# Patient Record
Sex: Female | Born: 1995 | Race: White | Hispanic: Yes | Marital: Single | Smoking: Never smoker
Health system: Southern US, Community
[De-identification: ages and names within clinical notes are randomized; demographics above are authoritative.]

## PROBLEM LIST (undated history)

## (undated) ENCOUNTER — Inpatient Hospital Stay (HOSPITAL_COMMUNITY): Payer: Self-pay

## (undated) DIAGNOSIS — Z789 Other specified health status: Secondary | ICD-10-CM

## (undated) HISTORY — PX: NO PAST SURGERIES: SHX2092

---
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
## 2017-01-28 DIAGNOSIS — A749 Chlamydial infection, unspecified: Secondary | ICD-10-CM

## 2017-01-28 HISTORY — DX: Chlamydial infection, unspecified: A74.9

## 2017-08-03 ENCOUNTER — Emergency Department (HOSPITAL_COMMUNITY): Payer: Self-pay

## 2017-08-03 ENCOUNTER — Encounter (HOSPITAL_COMMUNITY): Payer: Self-pay | Admitting: Emergency Medicine

## 2017-08-03 ENCOUNTER — Emergency Department (HOSPITAL_COMMUNITY)
Admission: EM | Admit: 2017-08-03 | Discharge: 2017-08-03 | Disposition: A | Discharge status: Home / Self Care | Payer: Self-pay | Attending: Emergency Medicine | Admitting: Emergency Medicine

## 2017-08-03 DIAGNOSIS — O209 Hemorrhage in early pregnancy, unspecified: Secondary | ICD-10-CM | POA: Insufficient documentation

## 2017-08-03 DIAGNOSIS — O23591 Infection of other part of genital tract in pregnancy, first trimester: Secondary | ICD-10-CM | POA: Insufficient documentation

## 2017-08-03 DIAGNOSIS — N939 Abnormal uterine and vaginal bleeding, unspecified: Secondary | ICD-10-CM

## 2017-08-03 DIAGNOSIS — O200 Threatened abortion: Secondary | ICD-10-CM | POA: Insufficient documentation

## 2017-08-03 DIAGNOSIS — N760 Acute vaginitis: Secondary | ICD-10-CM

## 2017-08-03 DIAGNOSIS — B9689 Other specified bacterial agents as the cause of diseases classified elsewhere: Secondary | ICD-10-CM

## 2017-08-03 DIAGNOSIS — Z3A01 Less than 8 weeks gestation of pregnancy: Secondary | ICD-10-CM | POA: Insufficient documentation

## 2017-08-03 LAB — WET PREP, GENITAL
SPERM: NONE SEEN
Trich, Wet Prep: NONE SEEN
Yeast Wet Prep HPF POC: NONE SEEN

## 2017-08-03 LAB — URINALYSIS, DIPSTICK ONLY
Bilirubin Urine: NEGATIVE
GLUCOSE, UA: NEGATIVE mg/dL
Ketones, ur: NEGATIVE mg/dL
Leukocytes, UA: NEGATIVE
Nitrite: NEGATIVE
PROTEIN: NEGATIVE mg/dL
SPECIFIC GRAVITY, URINE: 1.005 (ref 1.005–1.030)
pH: 7 (ref 5.0–8.0)

## 2017-08-03 LAB — ABO/RH: ABO/RH(D): O POS

## 2017-08-03 LAB — HCG, QUANTITATIVE, PREGNANCY: HCG, BETA CHAIN, QUANT, S: 5795 m[IU]/mL — AB (ref <5)

## 2017-08-03 MED ORDER — ACETAMINOPHEN 325 MG PO TABS
650.0000 mg | ORAL_TABLET | Freq: Once | ORAL | Status: DC
  Filled 2017-08-03: qty 2

## 2017-08-03 MED ORDER — METRONIDAZOLE 250 MG PO TABS: 250.0000 mg | ORAL_TABLET | Freq: Three times a day (TID) | ORAL | 0 refills | Status: AC

## 2017-08-03 NOTE — ED Triage Notes (Signed)
Pt presents to ED for assessment of vaginal bleeding with several positive home pregnancy tests.  Patient c/o some mid lower abdominal cramping.  Pt's LMP prior to this bleeding was 6/6.

## 2017-08-03 NOTE — ED Provider Notes (Signed)
[facility] EMERGENCY DEPARTMENT Provider Note   CSN: [id number] Arrival date & time: 08/03/17  1423   History   Chief Complaint Chief Complaint  Patient presents with  . Vaginal Bleeding    HPI Patient is a G1P0000 presenting to the ED for lower abdominal pain and vaginal bleeding. She states her LMP was 06/29/2017. She has had mild lower abdominal cramping for about 3-4 days and had onset of "light" vaginal bleeding this morning. She vomited once this morning. No fever, dysuria, or diarrhea. No vaginal discharge. No contraception. No other recent illness.   History reviewed. No pertinent past medical history.  There are no active problems to display for this patient.  History reviewed. No pertinent surgical history.   OB History    Gravida  1   Para      Term      Preterm      AB      Living        SAB      TAB      Ectopic      Multiple      Live Births               Home Medications    Prior to Admission medications   Medication Sig Start Date End Date Taking? Authorizing Provider  metroNIDAZOLE (FLAGYL) 250 MG tablet Take 1 tablet (250 mg total) by mouth 3 (three) times daily for 7 days. 08/03/17 08/10/17  Cecille Po, MD    Family History History reviewed. No pertinent family history.  Social History Social History   Tobacco Use  . Smoking status: Never Smoker  . Smokeless tobacco: Never Used  Substance Use Topics  . Alcohol use: Not Currently    Frequency: Never  . Drug use: Never     Allergies   Patient has no allergy information on record.   Review of Systems Review of Systems  Constitutional: Negative for fever.  HENT: Negative for congestion and sore throat.   Eyes: Negative for visual disturbance.  Respiratory: Negative for shortness of breath.   Cardiovascular: Negative for chest pain.  Gastrointestinal: Positive for abdominal pain and vomiting. Negative for diarrhea.  Genitourinary: Positive for  vaginal bleeding. Negative for dysuria and vaginal discharge.  Musculoskeletal: Negative for neck pain.  Neurological: Negative for syncope.  All other systems reviewed and are negative.    Physical Exam Updated Vital Signs BP 106/78   Pulse 77   Temp 98.2 F (36.8 C) (Oral)   Resp 16   LMP 07/03/2017   SpO2 98%   Physical Exam  Constitutional: She is oriented to person, place, and time. No distress.  HENT:  Head: Normocephalic and atraumatic.  Mouth/Throat: Oropharynx is clear and moist.  Eyes: Pupils are equal, round, and reactive to light. Conjunctivae are normal.  Neck: Neck supple. No tracheal deviation present.  Cardiovascular: Normal rate, regular rhythm, normal heart sounds and intact distal pulses.  No murmur heard. Pulmonary/Chest: Effort normal and breath sounds normal. No stridor. No respiratory distress. She has no wheezes. She has no rales.  Abdominal: Soft. She exhibits no distension and no mass. There is no tenderness. There is no guarding.  Genitourinary:  Genitourinary Comments: Speculum exam: small amount of blood in vault. Cervix visually closed. No POC visualized at os.  Musculoskeletal: She exhibits no edema or deformity.  Neurological: She is alert and oriented to person, place, and time.  Skin: Skin is warm and dry.  Psychiatric:  She has a normal mood and affect. Her behavior is normal.  Nursing note and vitals reviewed.    ED Treatments / Results  Labs (all labs ordered are listed, but only abnormal results are displayed) Labs Reviewed  WET PREP, GENITAL - Abnormal; Notable for the following components:      Result Value   Clue Cells Wet Prep HPF POC PRESENT (*)    WBC, Wet Prep HPF POC MODERATE (*)    All other components within normal limits  HCG, QUANTITATIVE, PREGNANCY - Abnormal; Notable for the following components:   hCG, Beta Chain, Quant, S 5,795 (*)    All other components within normal limits  URINALYSIS, DIPSTICK ONLY - Abnormal;  Notable for the following components:   Color, Urine STRAW (*)    Hgb urine dipstick LARGE (*)    All other components within normal limits  ABO/RH  GC/CHLAMYDIA PROBE AMP ([place]) NOT AT [facility]   EKG None  Radiology US Ob Comp Less 14 Wks  Result Date: 08/03/2017 CLINICAL DATA:  Pregnant patient with vaginal bleeding.  Cramping. EXAM: OBSTETRIC <14 WK [place] AND TRANSVAGINAL OB US TECHNIQUE: Both transabdominal and transvaginal ultrasound examinations were performed for complete evaluation of the gestation as well as the maternal uterus, adnexal regions, and pelvic cul-de-sac. Transvaginal technique was performed to assess early pregnancy. COMPARISON:  None. FINDINGS: Intrauterine gestational sac: Single Yolk sac:  Visualized. Embryo:  Not Visualized. MSD: 8.9 mm   5 w   5 d CRL:    mm    w    d                  [place] EDC: Subchorionic hemorrhage:  Small measuring 6.2 x 5.6 x 5.4 mm. Maternal uterus/adnexae: The ovaries are normal in appearance. Corpus luteum cyst in the left ovary. IMPRESSION: 1. Single IUP. The gestational sac and yolk sac are seen. No fetal pole at this time. 2. Small subchorionic hemorrhage. Electronically Signed   By: Gerome Sam III M.D   On: 08/03/2017 19:53   US Ob Transvaginal  Result Date: 08/03/2017 CLINICAL DATA:  Pregnant patient with vaginal bleeding.  Cramping. EXAM: OBSTETRIC <14 WK [place] AND TRANSVAGINAL OB US TECHNIQUE: Both transabdominal and transvaginal ultrasound examinations were performed for complete evaluation of the gestation as well as the maternal uterus, adnexal regions, and pelvic cul-de-sac. Transvaginal technique was performed to assess early pregnancy. COMPARISON:  None. FINDINGS: Intrauterine gestational sac: Single Yolk sac:  Visualized. Embryo:  Not Visualized. MSD: 8.9 mm   5 w   5 d CRL:    mm    w    d                  [place] EDC: Subchorionic hemorrhage:  Small measuring 6.2 x 5.6 x 5.4 mm. Maternal uterus/adnexae: The ovaries are normal in  appearance. Corpus luteum cyst in the left ovary. IMPRESSION: 1. Single IUP. The gestational sac and yolk sac are seen. No fetal pole at this time. 2. Small subchorionic hemorrhage. Electronically Signed   By: Gerome Sam III M.D   On: 08/03/2017 19:53    Procedures Procedures (including critical care time)  Medications Ordered in ED Medications - No data to display   Initial Impression / Assessment and Plan / ED Course  I have reviewed the triage vital signs and the nursing notes.  Pertinent labs & imaging results that were available during my care of the patient were reviewed by me and  considered in my medical decision making (see chart for details).  Clinical picture consistent with threatened abortion. Pelvic exam shows closed cervix. Quantitative hCG is 5795, appropriate for expected gestation. US shows IUP with gestational and yolk sac at 4424w5d and small subchorionic hemorrhage. No ectopic. Rh positive. Wet prep does show clue cells. I discussed risks and benefits of antibiotics versus asymptomatic BV. Prescription for Flagyl provided. Will discharge home with OB f/u.  Patient informed of all ED findings. Return precautions and follow up plan reviewed. All questions answered.   Final Clinical Impressions(s) / ED Diagnoses   Final diagnoses:  Vaginal bleeding  Threatened miscarriage  BV (bacterial vaginosis)    ED Discharge Orders        Ordered    metroNIDAZOLE (FLAGYL) 250 MG tablet  3 times daily     08/03/17 2039       Cecille PoMacklin, Noemie Devivo W, MD 08/04/17 1317    Lorre NickAllen, Anthony, MD 08/04/17 1424

## 2017-08-03 NOTE — ED Notes (Signed)
Pt is in US when RN went to hourly around

## 2017-08-03 NOTE — ED Notes (Signed)
[place] labs with hold clot sent to main lab.

## 2017-08-03 NOTE — ED Provider Notes (Signed)
I saw and evaluated the patient, reviewed the resident's note and I agree with the findings and plan.  EKG: None 22 year old female presents with 1 day of vaginal bleeding.  Has had several positive home pregnancy test.  Patient had a pelvic exam as well as pelvic ultrasound.  Patient likely is experiencing a miscarriage.   Lorre NickAllen, Nikolay Demetriou, MD 08/03/17 [phone]

## 2017-08-03 NOTE — ED Notes (Signed)
Pt understood dc material. NAD noted. 

## 2017-08-04 LAB — GC/CHLAMYDIA PROBE AMP (~~LOC~~) NOT AT ARMC
Chlamydia: NEGATIVE
Neisseria Gonorrhea: NEGATIVE

## 2017-09-30 ENCOUNTER — Inpatient Hospital Stay (HOSPITAL_COMMUNITY)
Admission: AD | Admit: 2017-09-30 | Discharge: 2017-09-30 | Disposition: A | Discharge status: Home / Self Care | Payer: Self-pay | Source: Ambulatory Visit | Attending: Obstetrics and Gynecology | Admitting: Obstetrics and Gynecology

## 2017-09-30 ENCOUNTER — Encounter (HOSPITAL_COMMUNITY): Payer: Self-pay | Admitting: *Deleted

## 2017-09-30 DIAGNOSIS — R109 Unspecified abdominal pain: Secondary | ICD-10-CM

## 2017-09-30 DIAGNOSIS — R1030 Lower abdominal pain, unspecified: Secondary | ICD-10-CM | POA: Insufficient documentation

## 2017-09-30 DIAGNOSIS — O26891 Other specified pregnancy related conditions, first trimester: Secondary | ICD-10-CM

## 2017-09-30 DIAGNOSIS — Y939 Activity, unspecified: Secondary | ICD-10-CM | POA: Insufficient documentation

## 2017-09-30 DIAGNOSIS — Z3A12 12 weeks gestation of pregnancy: Secondary | ICD-10-CM

## 2017-09-30 HISTORY — DX: Other specified health status: Z78.9

## 2017-09-30 LAB — URINALYSIS, ROUTINE W REFLEX MICROSCOPIC
Bilirubin Urine: NEGATIVE
Glucose, UA: NEGATIVE mg/dL
Ketones, ur: NEGATIVE mg/dL
Leukocytes, UA: NEGATIVE
Nitrite: NEGATIVE
PROTEIN: NEGATIVE mg/dL
SPECIFIC GRAVITY, URINE: 1.021 (ref 1.005–1.030)
pH: 6 (ref 5.0–8.0)

## 2017-09-30 MED ORDER — IBUPROFEN 600 MG PO TABS
600.0000 mg | ORAL_TABLET | Freq: Once | ORAL | Status: AC
  Administered 2017-09-30: 600 mg via ORAL
  Filled 2017-09-30: qty 1
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
## 2017-09-30 NOTE — Discharge Instructions (Signed)

## 2017-10-01 ENCOUNTER — Encounter (HOSPITAL_COMMUNITY): Payer: Self-pay

## 2017-10-01 ENCOUNTER — Encounter (HOSPITAL_COMMUNITY): Payer: Self-pay | Admitting: Pediatric Emergency Medicine

## 2017-10-13 ENCOUNTER — Encounter: Payer: Self-pay | Admitting: Obstetrics and Gynecology

## 2017-10-13 ENCOUNTER — Ambulatory Visit (INDEPENDENT_AMBULATORY_CARE_PROVIDER_SITE_OTHER): Payer: Self-pay | Admitting: Obstetrics and Gynecology

## 2017-10-13 VITALS — BP 119/80 | HR 79 | Wt 155.7 lb

## 2017-10-13 DIAGNOSIS — Z3401 Encounter for supervision of normal first pregnancy, first trimester: Secondary | ICD-10-CM

## 2017-10-13 DIAGNOSIS — Z3400 Encounter for supervision of normal first pregnancy, unspecified trimester: Secondary | ICD-10-CM | POA: Insufficient documentation

## 2017-10-13 DIAGNOSIS — Z124 Encounter for screening for malignant neoplasm of cervix: Secondary | ICD-10-CM

## 2017-10-13 DIAGNOSIS — Z113 Encounter for screening for infections with a predominantly sexual mode of transmission: Secondary | ICD-10-CM

## 2017-10-13 NOTE — Progress Notes (Signed)
Subjective:  Susan Anthony is a 22 y.o. G1P0000 at 1722w1d being seen today for first OB visit. EDD by certain LMP. No chronic medical problems or medications.  She is currently monitored for the following issues for this low-risk pregnancy and has Supervision of normal first pregnancy, antepartum on their problem list.  Patient reports occ N/V. Marland Kitchen.  Contractions: Not present. Vag. Bleeding: None.   . Denies leaking of fluid.   The following portions of the patient's history were reviewed and updated as appropriate: allergies, current medications, past family history, past medical history, past social history, past surgical history and problem list. Problem list updated.  Objective:   Vitals:   10/13/17 1024  BP: 119/80  Pulse: 79  Weight: 70.6 kg    Fetal Status: Fetal Heart Rate (bpm): 145         General:  Alert, oriented and cooperative. Patient is in no acute distress.  Skin: Skin is warm and dry. No rash noted.   Cardiovascular: Normal heart rate noted  Respiratory: Normal respiratory effort, no problems with respiration noted  Abdomen: Soft, gravid, appropriate for gestational age. Pain/Pressure: Absent     Pelvic:  Cervical exam performed        Extremities: Normal range of motion.  Edema: None  Mental Status: Normal mood and affect. Normal behavior. Normal judgment and thought content.  Breast Sym supple no masses or nipple discharge no adenopathy  Urinalysis:      Assessment and Plan:  Pregnancy: G1P0000 at 2322w1d  1. Supervision of normal first pregnancy, antepartum Prenatal labs and care reviewed with pt. Genetic testing reviewed with pt.  Anatomy scan ordered  Preterm labor symptoms and general obstetric precautions including but not limited to vaginal bleeding, contractions, leaking of fluid and fetal movement were reviewed in detail with the patient. Please refer to After Visit Summary for other counseling recommendations.  Return in about 4 weeks  (around 11/10/2017) for OB visit.   Hermina StaggersErvin, Michael L, MD

## 2017-10-13 NOTE — Patient Instructions (Signed)

## 2017-10-13 NOTE — Progress Notes (Addendum)
Patient is in the office for initial ob visit, unplanned pregnancy. FOB is involved, but they are currently not in a relationship. Pt has not started feeling fetal movement yet, denies any pain today. Pt aware of charges for genetic testing, due to [employer] program, pt still requests testing to be done.

## 2017-10-13 NOTE — Addendum Note (Signed)
Addended by: Natale MilchSTALLING, Norberto Wishon D on: 10/13/2017 11:29 AM   Modules accepted: Orders

## 2017-10-14 LAB — CYTOLOGY - PAP
Chlamydia: POSITIVE — AB
Diagnosis: NEGATIVE
Neisseria Gonorrhea: NEGATIVE

## 2017-10-15 ENCOUNTER — Other Ambulatory Visit: Payer: Self-pay

## 2017-10-15 DIAGNOSIS — O98812 Other maternal infectious and parasitic diseases complicating pregnancy, second trimester: Principal | ICD-10-CM

## 2017-10-15 DIAGNOSIS — A749 Chlamydial infection, unspecified: Secondary | ICD-10-CM

## 2017-10-15 MED ORDER — AZITHROMYCIN 500 MG PO TABS: 500.0000 mg | ORAL_TABLET | Freq: Every day | ORAL | 0 refills | Status: DC

## 2017-10-15 NOTE — Progress Notes (Signed)
Rx sent per Dr.Ervin Pt notified Pt voiced understanding.

## 2017-10-16 ENCOUNTER — Telehealth: Payer: Self-pay

## 2017-10-16 LAB — OBSTETRIC PANEL, INCLUDING HIV
Antibody Screen: NEGATIVE
BASOS ABS: 0 10*3/uL (ref 0.0–0.2)
Basos: 0 %
EOS (ABSOLUTE): 0 10*3/uL (ref 0.0–0.4)
Eos: 0 %
HIV SCREEN 4TH GENERATION: NONREACTIVE
Hematocrit: 38.2 % (ref 34.0–46.6)
Hemoglobin: 12.7 g/dL (ref 11.1–15.9)
Hepatitis B Surface Ag: NEGATIVE
Immature Grans (Abs): 0 10*3/uL (ref 0.0–0.1)
Immature Granulocytes: 0 %
LYMPHS ABS: 1.7 10*3/uL (ref 0.7–3.1)
Lymphs: 23 %
MCH: 30.2 pg (ref 26.6–33.0)
MCHC: 33.2 g/dL (ref 31.5–35.7)
MCV: 91 fL (ref 79–97)
MONOS ABS: 0.3 10*3/uL (ref 0.1–0.9)
Monocytes: 4 %
NEUTROS ABS: 5.5 10*3/uL (ref 1.4–7.0)
NEUTROS PCT: 73 %
PLATELETS: 176 10*3/uL (ref 150–450)
RBC: 4.21 x10E6/uL (ref 3.77–5.28)
RDW: 14.5 % (ref 12.3–15.4)
RH TYPE: POSITIVE
RPR: NONREACTIVE
Rubella Antibodies, IGG: 2.98 index (ref >0.99)
WBC: 7.5 10*3/uL (ref 3.4–10.8)

## 2017-10-16 LAB — HEMOGLOBINOPATHY EVALUATION
HGB A: 97.9 % (ref 96.4–98.8)
HGB C: 0 %
HGB S: 0 %
HGB VARIANT: 0 %
Hemoglobin A2 Quantitation: 2.1 % (ref 1.8–3.2)
Hemoglobin F Quantitation: 0 % (ref 0.0–2.0)

## 2017-10-16 LAB — URINE CULTURE, OB REFLEX

## 2017-10-16 LAB — CULTURE, OB URINE

## 2017-10-16 NOTE — Telephone Encounter (Signed)
Error in Rx sent Consulted with Dr.Ervin.  Pt notified to only take  2 tabs of 500 mg of  Zithromycin not 4 pt voiced understanding.

## 2017-10-27 ENCOUNTER — Telehealth: Payer: Self-pay

## 2017-10-27 DIAGNOSIS — Z3400 Encounter for supervision of normal first pregnancy, unspecified trimester: Secondary | ICD-10-CM

## 2017-10-27 NOTE — Telephone Encounter (Signed)
Returned call and answered questions about safe meds to take during pregnancy. Pt states that she has a non-itchy rash on her chest.

## 2017-10-30 ENCOUNTER — Telehealth: Payer: Self-pay

## 2017-10-30 NOTE — Telephone Encounter (Signed)
Pt called stating that she had some bleeding yesterday after having intercourse. Pt states that the bleeding has stopped. Bleeding was not heavy.   I reviewed symptoms of heavy bleeding, cramping, decreased fetal movement with pt. Advised if this occurs to be seen at MAU

## 2017-11-10 ENCOUNTER — Encounter: Payer: Self-pay | Admitting: Obstetrics & Gynecology

## 2017-11-10 ENCOUNTER — Ambulatory Visit (INDEPENDENT_AMBULATORY_CARE_PROVIDER_SITE_OTHER): Payer: Self-pay | Admitting: Obstetrics & Gynecology

## 2017-11-10 ENCOUNTER — Encounter: Payer: Self-pay | Admitting: *Deleted

## 2017-11-10 VITALS — BP 125/77 | HR 90 | Wt 159.0 lb

## 2017-11-10 DIAGNOSIS — O98812 Other maternal infectious and parasitic diseases complicating pregnancy, second trimester: Secondary | ICD-10-CM

## 2017-11-10 DIAGNOSIS — A749 Chlamydial infection, unspecified: Secondary | ICD-10-CM

## 2017-11-10 DIAGNOSIS — Z23 Encounter for immunization: Secondary | ICD-10-CM

## 2017-11-10 DIAGNOSIS — Z113 Encounter for screening for infections with a predominantly sexual mode of transmission: Secondary | ICD-10-CM

## 2017-11-10 DIAGNOSIS — Z3400 Encounter for supervision of normal first pregnancy, unspecified trimester: Secondary | ICD-10-CM

## 2017-11-10 DIAGNOSIS — N898 Other specified noninflammatory disorders of vagina: Secondary | ICD-10-CM

## 2017-11-10 NOTE — Patient Instructions (Addendum)
Thank you for enrolling in MyChart. Please follow the instructions below to securely access your online medical record. MyChart allows you to send messages to your doctor, view your test results, manage appointments, and more.   How Do I Sign Up? 1. In your Internet browser, go to [employer] and enter [email or web address]. 2. Click on the Sign Up Now link in the Sign In box. You will see the New Member Sign Up page. 3. Enter your MyChart Access Code exactly as it appears below. You will not need to use this code after you've completed the sign-up process. If you do not sign up before the expiration date, you must request a new code.  MyChart Access Code: [email or web address] Expires: 12/25/2017  9:04 AM  4. Enter your Social Security Number ([id number]) and Date of Birth (mm/dd/yyyy) as indicated and click Submit. You will be taken to the next sign-up page. 5. Create a MyChart ID. This will be your MyChart login ID and cannot be changed, so think of one that is secure and easy to remember. 6. Create a MyChart password. You can change your password at any time. 7. Enter your Password Reset Question and Answer. This can be used at a later time if you forget your password.  8. Enter your e-mail address. You will receive e-mail notification when new information is available in MyChart. 9. Click Sign Up. You can now view your medical record.   Additional Information Remember, MyChart is NOT to be used for urgent needs. For medical emergencies, dial 911.     Second Trimester of Pregnancy The second trimester is from week 13 through week 28, month 4 through 6. This is often the time in pregnancy that you feel your best. Often times, morning sickness has lessened or quit. You may have more energy, and you may get hungry more often. Your unborn baby (fetus) is growing rapidly. At the end of the sixth month, he or she is about 9 inches long and weighs about 1 pounds. You will likely feel  the baby move (quickening) between 18 and 20 weeks of pregnancy.  Research childbirth classes and hospital preregistration at [email or web address]  Follow these instructions at home:  Avoid all smoking, herbs, and alcohol. Avoid drugs not approved by your doctor.  Do not use any tobacco products, including cigarettes, chewing tobacco, and electronic cigarettes. If you need help quitting, ask your doctor. You may get counseling or other support to help you quit.  Only take medicine as told by your doctor. Some medicines are safe and some are not during pregnancy.  Exercise only as told by your doctor. Stop exercising if you start having cramps.  Eat regular, healthy meals.  Wear a good support bra if your breasts are tender.  Do not use hot tubs, steam rooms, or saunas.  Wear your seat belt when driving.  Avoid raw meat, uncooked cheese, and liter boxes and soil used by cats.  Take your prenatal vitamins.  Take 1500-2000 milligrams of calcium daily starting at the 20th week of pregnancy until you deliver your baby.  Try taking medicine that helps you poop (stool softener) as needed, and if your doctor approves. Eat more fiber by eating fresh fruit, vegetables, and whole grains. Drink enough fluids to keep your pee (urine) clear or pale yellow.  Take warm water baths (sitz baths) to soothe pain or discomfort caused by hemorrhoids. Use hemorrhoid cream if your doctor approves.  If you have puffy, bulging veins (varicose veins), wear  support hose. Raise (elevate) your feet for 15 minutes, 3-4 times a day. Limit salt in your diet.  Avoid heavy lifting, wear low heals, and sit up straight.  Rest with your legs raised if you have leg cramps or low back pain.  Visit your dentist if you have not gone during your pregnancy. Use a soft toothbrush to brush your teeth. Be gentle when you floss.  You can have sex (intercourse) unless your doctor tells you not to.  Go to your doctor  visits.  Get help if:  You feel dizzy.  You have mild cramps or pressure in your lower belly (abdomen).  You have a nagging pain in your belly area.  You continue to feel sick to your stomach (nauseous), throw up (vomit), or have watery poop (diarrhea).  You have bad smelling fluid coming from your vagina.  You have pain with peeing (urination). Get help right away if:  You have a fever.  You are leaking fluid from your vagina.  You have spotting or bleeding from your vagina.  You have severe belly cramping or pain.  You lose or gain weight rapidly.  You have trouble catching your breath and have chest pain.  You notice sudden or extreme puffiness (swelling) of your face, hands, ankles, feet, or legs.  You have not felt the baby move in over an hour.  You have severe headaches that do not go away with medicine.  You have vision changes. This information is not intended to replace advice given to you by your health care provider. Make sure you discuss any questions you have with your health care provider. Document Released: 04/10/2009 Document Revised: 06/22/2015 Document Reviewed: 03/17/2012 Elsevier Interactive Patient Education  2017 [employer].

## 2017-11-10 NOTE — Progress Notes (Signed)
   PRENATAL VISIT NOTE  Subjective:  Susan Anthony is a 22 y.o. G1P0000 at [redacted]w[redacted]d being seen today for ongoing prenatal care.  She is currently monitored for the following issues for this low-risk pregnancy and has Supervision of normal first pregnancy, antepartum on their problem list.  Patient reports no complaints.  Contractions: Not present. Vag. Bleeding: None.  Movement: Present. Denies leaking of fluid.   The following portions of the patient's history were reviewed and updated as appropriate: allergies, current medications, past family history, past medical history, past social history, past surgical history and problem list. Problem list updated.  Objective:   Vitals:   11/10/17 0842  BP: 125/77  Pulse: 90  Weight: 159 lb (72.1 kg)    Fetal Status: Fetal Heart Rate (bpm): 150 Fundal Height: 19 cm Movement: Present     General:  Alert, oriented and cooperative. Patient is in no acute distress.  Skin: Skin is warm and dry. No rash noted.   Cardiovascular: Normal heart rate noted  Respiratory: Normal respiratory effort, no problems with respiration noted  Abdomen: Soft, gravid, appropriate for gestational age.  Pain/Pressure: Absent     Pelvic: Cervical exam deferred        Extremities: Normal range of motion.  Edema: None  Mental Status: Normal mood and affect. Normal behavior. Normal judgment and thought content.   Assessment and Plan:  Pregnancy: G1P0000 at [redacted]w[redacted]d  1. Chlamydia infection complicating pregnancy, second trimester TOC done today, will follow up results and manage accordingly. - Cervicovaginal ancillary only  2. Flu vaccine need Will get at [facility] hopefully today when there for her scan.  3. Supervision of normal first pregnancy, antepartum Low risk NIPS result discussed with patient; she will get anatomy scan today at [facility]. Preterm labor symptoms and general obstetric precautions including but not limited to vaginal bleeding, contractions,  leaking of fluid and fetal movement were reviewed in detail with the patient. Please refer to After Visit Summary for other counseling recommendations.  Return in about 4 weeks (around 12/08/2017) for OB Visit.  Future Appointments  Date Time Provider Department Center  12/08/2017 10:00 AM Constant, Gigi Gin, MD [facility] None    Jaynie Collins, MD

## 2017-11-11 LAB — CERVICOVAGINAL ANCILLARY ONLY
BACTERIAL VAGINITIS: NEGATIVE
CANDIDA VAGINITIS: NEGATIVE
Chlamydia: NEGATIVE
Neisseria Gonorrhea: NEGATIVE
TRICH (WINDOWPATH): NEGATIVE

## 2017-12-08 ENCOUNTER — Encounter: Payer: Self-pay | Admitting: Obstetrics and Gynecology

## 2017-12-09 ENCOUNTER — Encounter: Payer: Self-pay | Admitting: Obstetrics and Gynecology

## 2017-12-11 ENCOUNTER — Ambulatory Visit (INDEPENDENT_AMBULATORY_CARE_PROVIDER_SITE_OTHER): Payer: Self-pay | Admitting: Advanced Practice Midwife

## 2017-12-11 VITALS — BP 114/71 | HR 99 | Wt 162.4 lb

## 2017-12-11 DIAGNOSIS — Z3402 Encounter for supervision of normal first pregnancy, second trimester: Secondary | ICD-10-CM

## 2017-12-11 DIAGNOSIS — Z23 Encounter for immunization: Secondary | ICD-10-CM

## 2017-12-11 DIAGNOSIS — O98812 Other maternal infectious and parasitic diseases complicating pregnancy, second trimester: Secondary | ICD-10-CM

## 2017-12-11 DIAGNOSIS — O98819 Other maternal infectious and parasitic diseases complicating pregnancy, unspecified trimester: Secondary | ICD-10-CM

## 2017-12-11 DIAGNOSIS — A749 Chlamydial infection, unspecified: Secondary | ICD-10-CM | POA: Insufficient documentation

## 2017-12-11 DIAGNOSIS — Z3400 Encounter for supervision of normal first pregnancy, unspecified trimester: Secondary | ICD-10-CM

## 2017-12-11 NOTE — Progress Notes (Signed)
   PRENATAL VISIT NOTE  Subjective:  Susan Anthony is a 22 y.o. G1P0000 at 8199w4d being seen today for ongoing prenatal care.  She is currently monitored for the following issues for this low-risk pregnancy and has Supervision of normal first pregnancy, antepartum and Chlamydia infection affecting pregnancy on their problem list.  Patient reports 1-2 mild contractions. Endorses drinking 8 glasses of water throughout the day but has been struggling to stay hydrated for the past two days..  Contractions: Not present. Vag. Bleeding: None.  Movement: Present. Denies leaking of fluid.   The following portions of the patient's history were reviewed and updated as appropriate: allergies, current medications, past family history, past medical history, past social history, past surgical history and problem list. Problem list updated.  Objective:   Vitals:   12/11/17 1012  BP: 114/71  Pulse: 99  Weight: 162 lb 6.4 oz (73.7 kg)    Fetal Status: Fetal Heart Rate (bpm): 145   Movement: Present     General:  Alert, oriented and cooperative. Patient is in no acute distress.  Skin: Skin is warm and dry. No rash noted.   Cardiovascular: Normal heart rate noted  Respiratory: Normal respiratory effort, no problems with respiration noted  Abdomen: Soft, gravid, appropriate for gestational age.  Pain/Pressure: Absent     Pelvic: Cervical exam deferred        Extremities: Normal range of motion.  Edema: None  Mental Status: Normal mood and affect. Normal behavior. Normal judgment and thought content.   Assessment and Plan:  Pregnancy: G1P0000 at 4099w4d  1. Supervision of normal first pregnancy, antepartum --Encourage PO hydration with water, 8-10 glasses per day. Seek care in MAU for cramping that is frequent, is painful and/or accompanied by vaginal bleeding --Third trimester labs and GTT next visit --Has not had growth ultrasound yet. [facility]  2. Flu  vaccine need --[facility]  3. Chlamydia infection complicating pregnancy, second trimester --Treated 09/18, negative TOC 11/10/17  Preterm labor symptoms and general obstetric precautions including but not limited to vaginal bleeding, contractions, leaking of fluid and fetal movement were reviewed in detail with the patient. Please refer to After Visit Summary for other counseling recommendations.  Return in about 4 weeks (around 01/08/2018).  Future Appointments  Date Time Provider Department Center  01/08/2018  9:30 AM Sharyon Cableogers, Veronica C, CNM [facility] None    Calvert CantorSamantha C Feliza Diven, [place]

## 2017-12-11 NOTE — Patient Instructions (Signed)
Eating Plan for Pregnant Women While you are pregnant, your body will require additional nutrition to help support your growing baby. It is recommended that you consume:  150 additional calories each day during your first trimester.  300 additional calories each day during your second trimester.  300 additional calories each day during your third trimester.  Eating a healthy, well-balanced diet is very important for your health and for your baby's health. You also have a higher need for some vitamins and minerals, such as folic acid, calcium, iron, and vitamin D. What do I need to know about eating during pregnancy?  Do not try to lose weight or go on a diet during pregnancy.  Choose healthy, nutritious foods. Choose  of a sandwich with a glass of milk instead of a candy bar or a high-calorie sugar-sweetened beverage.  Limit your overall intake of foods that have "empty calories." These are foods that have little nutritional value, such as sweets, desserts, candies, sugar-sweetened beverages, and fried foods.  Eat a variety of foods, especially fruits and vegetables.  Take a prenatal vitamin to help meet the additional needs during pregnancy, specifically for folic acid, iron, calcium, and vitamin D.  Remember to stay active. Ask your health care provider for exercise recommendations that are specific to you.  Practice good food safety and cleanliness, such as washing your hands before you eat and after you prepare raw meat. This helps to prevent foodborne illnesses, such as listeriosis, that can be very dangerous for your baby. Ask your health care provider for more information about listeriosis. What does 150 extra calories look like? Healthy options for an additional 150 calories each day could be any of the following:  Plain low-fat yogurt (6-8 oz) with  cup of berries.  1 apple with 2 teaspoons of peanut butter.  Cut-up vegetables with  cup of hummus.  Low-fat chocolate  milk (8 oz or 1 cup).  1 string cheese with 1 medium orange.   of a peanut butter and jelly sandwich on whole-wheat bread (1 tsp of peanut butter).  For 300 calories, you could eat two of those healthy options each day. What is a healthy amount of weight to gain? The recommended amount of weight for you to gain is based on your pre-pregnancy BMI. If your pre-pregnancy BMI was:  Less than 18 (underweight), you should gain 28-40 lb.  18-24.9 (normal), you should gain 25-35 lb.  25-29.9 (overweight), you should gain 15-25 lb.  Greater than 30 (obese), you should gain 11-20 lb.  What if I am having twins or multiples? Generally, pregnant women who will be having twins or multiples may need to increase their daily calories by 300-600 calories each day. The recommended range for total weight gain is 25-54 lb, depending on your pre-pregnancy BMI. Talk with your health care provider for specific guidance about additional nutritional needs, weight gain, and exercise during your pregnancy. What foods can I eat? Grains Any grains. Try to choose whole grains, such as whole-wheat bread, oatmeal, or brown rice. Vegetables Any vegetables. Try to eat a variety of colors and types of vegetables to get a full range of vitamins and minerals. Remember to wash your vegetables well before eating. Fruits Any fruits. Try to eat a variety of colors and types of fruit to get a full range of vitamins and minerals. Remember to wash your fruits well before eating. Meats and Other Protein Sources Lean meats, including chicken, turkey, fish, and lean cuts of beef, veal,   or pork. Make sure that all meats are cooked to "well done." Tofu. Tempeh. Beans. Eggs. Peanut butter and other nut butters. Seafood, such as shrimp, crab, and lobster. If you choose fish, select types that are higher in omega-3 fatty acids, including salmon, herring, mussels, trout, sardines, and pollock. Make sure that all meats are cooked to  food-safe temperatures. Dairy Pasteurized milk and milk alternatives. Pasteurized yogurt and pasteurized cheese. Cottage cheese. Sour cream. Beverages Water. Juices that contain 100% fruit juice or vegetable juice. Caffeine-free teas and decaffeinated coffee. Drinks that contain caffeine are okay to drink, but it is better to avoid caffeine. Keep your total caffeine intake to less than 200 mg each day (12 oz of coffee, tea, or soda) or as directed by your health care provider. Condiments Any pasteurized condiments. Sweets and Desserts Any sweets and desserts. Fats and Oils Any fats and oils. The items listed above may not be a complete list of recommended foods or beverages. Contact your dietitian for more options. What foods are not recommended? Vegetables Unpasteurized (raw) vegetable juices. Fruits Unpasteurized (raw) fruit juices. Meats and Other Protein Sources Cured meats that have nitrates, such as bacon, salami, and hotdogs. Luncheon meats, bologna, or other deli meats (unless they are reheated until they are steaming hot). Refrigerated pate, meat spreads from a meat counter, smoked seafood that is found in the refrigerated section of a store. Raw fish, such as sushi or sashimi. High mercury content fish, such as tilefish, shark, swordfish, and king mackerel. Raw meats, such as tuna or beef tartare. Undercooked meats and poultry. Make sure that all meats are cooked to food-safe temperatures. Dairy Unpasteurized (raw) milk and any foods that have raw milk in them. Soft cheeses, such as feta, queso blanco, queso fresco, Brie, Camembert cheeses, blue-veined cheeses, and Panela cheese (unless it is made with pasteurized milk, which must be stated on the label). Beverages Alcohol. Sugar-sweetened beverages, such as sodas, teas, or energy drinks. Condiments Homemade fermented foods and drinks, such as pickles, sauerkraut, or kombucha drinks. (Store-bought pasteurized versions of these are  okay.) Other Salads that are made in the store, such as ham salad, chicken salad, egg salad, tuna salad, and seafood salad. The items listed above may not be a complete list of foods and beverages to avoid. Contact your dietitian for more information. This information is not intended to replace advice given to you by your health care provider. Make sure you discuss any questions you have with your health care provider. Document Released: 10/29/2013 Document Revised: 06/22/2015 Document Reviewed: 06/29/2013 Elsevier Interactive Patient Education  2018 Elsevier Inc.   

## 2018-01-08 ENCOUNTER — Encounter: Payer: Self-pay | Admitting: Certified Nurse Midwife

## 2018-01-08 ENCOUNTER — Other Ambulatory Visit: Payer: Self-pay

## 2018-01-08 ENCOUNTER — Ambulatory Visit (INDEPENDENT_AMBULATORY_CARE_PROVIDER_SITE_OTHER): Payer: Self-pay | Admitting: Certified Nurse Midwife

## 2018-01-08 VITALS — BP 121/67 | HR 88 | Wt 164.2 lb

## 2018-01-08 DIAGNOSIS — Z3402 Encounter for supervision of normal first pregnancy, second trimester: Secondary | ICD-10-CM

## 2018-01-08 DIAGNOSIS — R300 Dysuria: Secondary | ICD-10-CM

## 2018-01-08 DIAGNOSIS — Z3400 Encounter for supervision of normal first pregnancy, unspecified trimester: Secondary | ICD-10-CM

## 2018-01-08 DIAGNOSIS — O26892 Other specified pregnancy related conditions, second trimester: Secondary | ICD-10-CM

## 2018-01-08 DIAGNOSIS — O26899 Other specified pregnancy related conditions, unspecified trimester: Secondary | ICD-10-CM

## 2018-01-08 NOTE — Patient Instructions (Signed)

## 2018-01-08 NOTE — Progress Notes (Signed)
   PRENATAL VISIT NOTE  Subjective:  Susan Anthony is a 22 y.o. G1P0000 at 378w4d being seen today for ongoing prenatal care.  She is currently monitored for the following issues for this low-risk pregnancy and has Supervision of normal first pregnancy, antepartum and Chlamydia infection affecting pregnancy on their problem list.  Patient reports dysuria and retention.  Contractions: Irritability. Vag. Bleeding: None.  Movement: Present. Denies leaking of fluid.   The following portions of the patient's history were reviewed and updated as appropriate: allergies, current medications, past family history, past medical history, past social history, past surgical history and problem list. Problem list updated.  Objective:   Vitals:   01/08/18 0930  BP: 121/67  Pulse: 88  Weight: 164 lb 3.2 oz (74.5 kg)    Fetal Status: Fetal Heart Rate (bpm): 146 Fundal Height: 27 cm Movement: Present     General:  Alert, oriented and cooperative. Patient is in no acute distress.  Skin: Skin is warm and dry. No rash noted.   Cardiovascular: Normal heart rate noted  Respiratory: Normal respiratory effort, no problems with respiration noted  Abdomen: Soft, gravid, appropriate for gestational age.  Pain/Pressure: Absent     Pelvic: Cervical exam deferred        Extremities: Normal range of motion.  Edema: None  Mental Status: Normal mood and affect. Normal behavior. Normal judgment and thought content.   Assessment and Plan:  Pregnancy: G1P0000 at 488w4d  1. Supervision of normal first pregnancy, antepartum - Routine care and anticipatory guidance on upcoming appointments - Educated on prenatal care with 2 week appointments until 36 weeks  - GTT and labs completed in office today  - [facility], WNL, dating consistent with LMP - Glucose Tolerance, 2 Hours w/1 Hour - CBC - RPR - HIV Antibody (routine testing w rflx)  2. Dysuria during pregnancy,  antepartum - Reports dysuria and retention occurring for the past 3 weeks  - "feels like I still need to urinate after I finish"  - Denies hx of UTI during pregnancy or prior to pregnancy  - Encouraged to increase amount of water she consumes throughout the day to 8-10 glasses, patient verbalizes understanding.  - Urine Culture  Preterm labor symptoms and general obstetric precautions including but not limited to vaginal bleeding, contractions, leaking of fluid and fetal movement were reviewed in detail with the patient. Please refer to After Visit Summary for other counseling recommendations.  Return in about 2 weeks (around 01/22/2018) for ROB.  Future Appointments  Date Time Provider Department Center  01/22/2018  2:45 PM Sharyon Cableogers, Raydon Chappuis C, CNM [facility] None    Sharyon CableVeronica C Maysa Lynn, CNM

## 2018-01-08 NOTE — Progress Notes (Signed)
Pt is here for ROB G1P0 2936w4d.

## 2018-01-09 LAB — CBC
Hematocrit: 30.1 % — ABNORMAL LOW (ref 34.0–46.6)
Hemoglobin: 10.4 g/dL — ABNORMAL LOW (ref 11.1–15.9)
MCH: 31.3 pg (ref 26.6–33.0)
MCHC: 34.6 g/dL (ref 31.5–35.7)
MCV: 91 fL (ref 79–97)
Platelets: 162 10*3/uL (ref 150–450)
RBC: 3.32 x10E6/uL — ABNORMAL LOW (ref 3.77–5.28)
RDW: 11.5 % — ABNORMAL LOW (ref 12.3–15.4)
WBC: 7.7 10*3/uL (ref 3.4–10.8)

## 2018-01-09 LAB — RPR: RPR Ser Ql: NONREACTIVE

## 2018-01-09 LAB — GLUCOSE TOLERANCE, 2 HOURS W/ 1HR
Glucose, 1 hour: 90 mg/dL (ref 65–179)
Glucose, 2 hour: 76 mg/dL (ref 65–152)
Glucose, Fasting: 74 mg/dL (ref 65–91)

## 2018-01-09 LAB — HIV ANTIBODY (ROUTINE TESTING W REFLEX): HIV Screen 4th Generation wRfx: NONREACTIVE

## 2018-01-11 LAB — URINE CULTURE

## 2018-01-22 ENCOUNTER — Ambulatory Visit (INDEPENDENT_AMBULATORY_CARE_PROVIDER_SITE_OTHER): Payer: Self-pay | Admitting: Certified Nurse Midwife

## 2018-01-22 ENCOUNTER — Encounter: Payer: Self-pay | Admitting: Certified Nurse Midwife

## 2018-01-22 VITALS — BP 115/76 | HR 88 | Wt 166.2 lb

## 2018-01-22 DIAGNOSIS — Z3400 Encounter for supervision of normal first pregnancy, unspecified trimester: Secondary | ICD-10-CM

## 2018-01-22 DIAGNOSIS — Z3403 Encounter for supervision of normal first pregnancy, third trimester: Secondary | ICD-10-CM

## 2018-01-22 NOTE — Progress Notes (Signed)
   PRENATAL VISIT NOTE  Subjective:  Susan Anthony is a 22 y.o. G1P0000 at 8148w4d being seen today for ongoing prenatal care.  She is currently monitored for the following issues for this low-risk pregnancy and has Supervision of normal first pregnancy, antepartum and Chlamydia infection affecting pregnancy on their problem list.  Patient reports no complaints.  Contractions: Irritability. Vag. Bleeding: None.  Movement: Present. Denies leaking of fluid.   The following portions of the patient's history were reviewed and updated as appropriate: allergies, current medications, past family history, past medical history, past social history, past surgical history and problem list. Problem list updated.  Objective:   Vitals:   01/22/18 1432  BP: 115/76  Pulse: 88  Weight: 166 lb 3.2 oz (75.4 kg)    Fetal Status: Fetal Heart Rate (bpm): 138 Fundal Height: 30 cm Movement: Present     General:  Alert, oriented and cooperative. Patient is in no acute distress.  Skin: Skin is warm and dry. No rash noted.   Cardiovascular: Normal heart rate noted  Respiratory: Normal respiratory effort, no problems with respiration noted  Abdomen: Soft, gravid, appropriate for gestational age.  Pain/Pressure: Absent     Pelvic: Cervical exam deferred        Extremities: Normal range of motion.  Edema: None  Mental Status: Normal mood and affect. Normal behavior. Normal judgment and thought content.   Assessment and Plan:  Pregnancy: G1P0000 at 6548w4d  1. Supervision of normal first pregnancy, antepartum - Patient doing well, no complaints - Anticipatory guidance on upcoming appointments  - Preterm labor precautions and reasons to be evaluated in MAU discussed   Preterm labor symptoms and general obstetric precautions including but not limited to vaginal bleeding, contractions, leaking of fluid and fetal movement were reviewed in detail with the patient. Please refer to After Visit Summary  for other counseling recommendations.  Return in about 2 weeks (around 02/05/2018) for ROB.  Future Appointments  Date Time Provider Department Center  02/05/2018 11:00 AM Sharyon Cableogers, Kelley Polinsky C, CNM [facility] None    Sharyon CableVeronica C Koi Zangara, [place]

## 2018-01-22 NOTE — Progress Notes (Signed)
Pt is here for ROB. 8354w4d.

## 2018-01-22 NOTE — Patient Instructions (Signed)

## 2018-01-28 NOTE — L&D Delivery Note (Signed)
Patient is a 23 y.o. now G1P1 s/p NSVD at [redacted]w[redacted]d, who was admitted for SOL.  She progressed with augmentation (AROM) to complete and pushed 7 minutes to deliver.  Cord clamping delayed by several minutes then clamped by CNM and cut by FOB.  Placenta intact and spontaneous, bleeding minimal.  Right sulcus and labial laceration repaired without difficulty.  Mom and baby stable prior to transfer to postpartum. She plans on breastfeeding. She requests IUD for birth control.  Delivery Note At 1:20 PM a viable and healthy female was delivered via Vaginal, Spontaneous (Presentation: ROA ).  APGAR: 9, 9; weight pending .   Placenta intact and spontaneous, bleeding minimal 3 vessel cord and delivery with no complications  Anesthesia: IV pain medication  Episiotomy: None Lacerations: Sulcus;Labial Suture Repair: 3.0 vicryl and 4.0 monocryl  Est. Blood Loss (mL): 625   Mom to postpartum.  Baby to Couplet care / Skin to Skin.  Sharyon Cable CNM 03/25/2018, 2:09 PM

## 2018-02-05 ENCOUNTER — Ambulatory Visit (INDEPENDENT_AMBULATORY_CARE_PROVIDER_SITE_OTHER): Payer: Self-pay | Admitting: Certified Nurse Midwife

## 2018-02-05 ENCOUNTER — Encounter: Payer: Self-pay | Admitting: Certified Nurse Midwife

## 2018-02-05 VITALS — BP 124/69 | HR 101 | Wt 169.0 lb

## 2018-02-05 DIAGNOSIS — Z3403 Encounter for supervision of normal first pregnancy, third trimester: Secondary | ICD-10-CM

## 2018-02-05 DIAGNOSIS — Z3A31 31 weeks gestation of pregnancy: Secondary | ICD-10-CM

## 2018-02-05 DIAGNOSIS — Z3400 Encounter for supervision of normal first pregnancy, unspecified trimester: Secondary | ICD-10-CM

## 2018-02-05 NOTE — Patient Instructions (Addendum)

## 2018-02-06 NOTE — Progress Notes (Signed)
   PRENATAL VISIT NOTE  Subjective:  Susan Anthony is a 23 y.o. G1P0000 at [redacted]w[redacted]d being seen today for ongoing prenatal care.  She is currently monitored for the following issues for this low-risk pregnancy and has Supervision of normal first pregnancy, antepartum and Chlamydia infection affecting pregnancy on their problem list.  Patient reports no complaints.  Contractions: Not present. Vag. Bleeding: None.  Movement: Present. Denies leaking of fluid.   The following portions of the patient's history were reviewed and updated as appropriate: allergies, current medications, past family history, past medical history, past social history, past surgical history and problem list. Problem list updated.  Objective:  Vitals:   02/05/18 1116  BP: 124/69  Pulse: (!) 101  Weight: 169 lb (76.7 kg)    Fetal Status: Fetal Heart Rate (bpm): 140 Fundal Height: 33 cm Movement: Present     General:  Alert, oriented and cooperative. Patient is in no acute distress.  Skin: Skin is warm and dry. No rash noted.   Cardiovascular: Normal heart rate noted  Respiratory: Normal respiratory effort, no problems with respiration noted  Abdomen: Soft, gravid, appropriate for gestational age.  Pain/Pressure: Present     Pelvic: Cervical exam deferred        Extremities: Normal range of motion.  Edema: Trace  Mental Status: Normal mood and affect. Normal behavior. Normal judgment and thought content.   Assessment and Plan:  Pregnancy: G1P0000 at [redacted]w[redacted]d  1. Supervision of normal first pregnancy, antepartum - Patient doing well, no complaints  - Anticipatory guidance on upcoming appointments  - Discussed results of labs from 28 week appointment, normal GTT  - Encourage to consume more iron in diet over the course of pregnancy due to HGB of 10.4, patient verbalizes understanding   Preterm labor symptoms and general obstetric precautions including but not limited to vaginal bleeding, contractions,  leaking of fluid and fetal movement were reviewed in detail with the patient. Please refer to After Visit Summary for other counseling recommendations.  Return in about 2 weeks (around 02/19/2018) for ROB.  Future Appointments  Date Time Provider Department Center  02/19/2018  4:15 PM Gerrit Heck, CNM [facility] None    Sharyon Cable, CNM

## 2018-02-19 ENCOUNTER — Ambulatory Visit (INDEPENDENT_AMBULATORY_CARE_PROVIDER_SITE_OTHER): Payer: Self-pay

## 2018-02-19 VITALS — BP 125/81 | HR 108 | Wt 171.8 lb

## 2018-02-19 DIAGNOSIS — Z23 Encounter for immunization: Secondary | ICD-10-CM

## 2018-02-19 DIAGNOSIS — Z3400 Encounter for supervision of normal first pregnancy, unspecified trimester: Secondary | ICD-10-CM

## 2018-02-19 DIAGNOSIS — Z3403 Encounter for supervision of normal first pregnancy, third trimester: Secondary | ICD-10-CM

## 2018-02-19 NOTE — Patient Instructions (Signed)

## 2018-02-19 NOTE — Progress Notes (Signed)
   PRENATAL VISIT NOTE  Subjective:  Susan Anthony is a 23 y.o. G1P0000 at [redacted]w[redacted]d being seen today for ongoing prenatal care.  She is currently monitored for the following issues for this low-risk pregnancy and has Supervision of normal first pregnancy, antepartum and Chlamydia infection affecting pregnancy on their problem list.  Patient reports no complaints.  Contractions: Not present. Vag. Bleeding: None.  Movement: Present. Denies leaking of fluid. She states she has been considering IUD for PP contraception, but is unsure.  Patient also reports that she desires a "natural labor" which to her means vaginal and without an epidural.  She states she is scheduled for a childbirth and breastfeeding course in about 2 weeks.   The following portions of the patient's history were reviewed and updated as appropriate: allergies, current medications, past family history, past medical history, past social history, past surgical history and problem list. Problem list updated.  Objective:   Vitals:   02/19/18 1634  BP: 125/81  Pulse: (!) 108  Weight: 171 lb 12.8 oz (77.9 kg)    Fetal Status: Fetal Heart Rate (bpm): 132   Movement: Present     General:  Alert, oriented and cooperative. Patient is in no acute distress.  Skin: Skin is warm and dry. No rash noted.   Cardiovascular: Normal heart rate noted  Respiratory: Normal respiratory effort, no problems with respiration noted  Abdomen: Soft, gravid, appropriate for gestational age.  Pain/Pressure: Present   33cm  Pelvic: Cervical exam deferred        Extremities: Normal range of motion.  Edema: None  Mental Status: Normal mood and affect. Normal behavior. Normal judgment and thought content.   Assessment and Plan:  Pregnancy: G1P0000 at [redacted]w[redacted]d  1. Supervision of normal first pregnancy, antepartum -Discussed various IUD including hormonal vs non-hormonal. *Phamplets given for Mirena, Paragard, and Liletta. -Discussed ROB in  ~2-3 weeks with GBS and GC/CT testing *Explained GBS, necessity for testing, and treatment in labor if necessary.  2. Flu Vaccine Need -Discussed obtaining influenza and Tdap vaccinations. *Letter given for patient to receive at [facility].   Term labor symptoms and general obstetric precautions including but not limited to vaginal bleeding, contractions, leaking of fluid and fetal movement were reviewed in detail with the patient. Please refer to After Visit Summary for other counseling recommendations.  Return in about 2 weeks (around 03/05/2018) for ROB.  Future Appointments  Date Time Provider Department Center  03/05/2018 11:15 AM Sharyon Cable, CNM [facility] None    Joyce Copa [place], [place] 02/19/2018

## 2018-03-05 ENCOUNTER — Encounter: Payer: Self-pay | Admitting: Certified Nurse Midwife

## 2018-03-05 ENCOUNTER — Ambulatory Visit (INDEPENDENT_AMBULATORY_CARE_PROVIDER_SITE_OTHER): Payer: Self-pay | Admitting: Certified Nurse Midwife

## 2018-03-05 VITALS — BP 127/82 | HR 95 | Wt 171.0 lb

## 2018-03-05 DIAGNOSIS — Z3403 Encounter for supervision of normal first pregnancy, third trimester: Secondary | ICD-10-CM

## 2018-03-05 DIAGNOSIS — Z3400 Encounter for supervision of normal first pregnancy, unspecified trimester: Secondary | ICD-10-CM

## 2018-03-06 NOTE — Progress Notes (Signed)
   PRENATAL VISIT NOTE  Subjective:  Susan Anthony is a 23 y.o. G1P0000 at [redacted]w[redacted]d being seen today for ongoing prenatal care.  She is currently monitored for the following issues for this low-risk pregnancy and has Supervision of normal first pregnancy, antepartum and Chlamydia infection affecting pregnancy on their problem list.  Patient reports no complaints.  Contractions: Not present. Vag. Bleeding: None.  Movement: Present. Denies leaking of fluid.   The following portions of the patient's history were reviewed and updated as appropriate: allergies, current medications, past family history, past medical history, past social history, past surgical history and problem list. Problem list updated.  Objective:   Vitals:   03/05/18 1102  BP: 127/82  Pulse: 95  Weight: 171 lb (77.6 kg)    Fetal Status: Fetal Heart Rate (bpm): 150 Fundal Height: 33 cm Movement: Present     General:  Alert, oriented and cooperative. Patient is in no acute distress.  Skin: Skin is warm and dry. No rash noted.   Cardiovascular: Normal heart rate noted  Respiratory: Normal respiratory effort, no problems with respiration noted  Abdomen: Soft, gravid, appropriate for gestational age.  Pain/Pressure: Present     Pelvic: Cervical exam deferred        Extremities: Normal range of motion.  Edema: None  Mental Status: Normal mood and affect. Normal behavior. Normal judgment and thought content.   Assessment and Plan:  Pregnancy: G1P0000 at [redacted]w[redacted]d  1. Supervision of normal first pregnancy, antepartum - Patient doing well, no complaints - Anticipatory guidance on upcoming appointments with GBS screening at next appointment  - Routine care  - Labor precautions discussed   Preterm labor symptoms and general obstetric precautions including but not limited to vaginal bleeding, contractions, leaking of fluid and fetal movement were reviewed in detail with the patient. Please refer to After Visit  Summary for other counseling recommendations.  Return in about 1 week (around 03/12/2018) for ROB.  Future Appointments  Date Time Provider Department Center  03/12/2018  2:45 PM Sharyon Cable, CNM [facility] None    Sharyon Cable, [place]

## 2018-03-12 ENCOUNTER — Ambulatory Visit (INDEPENDENT_AMBULATORY_CARE_PROVIDER_SITE_OTHER): Payer: Self-pay | Admitting: Certified Nurse Midwife

## 2018-03-12 ENCOUNTER — Encounter: Payer: Self-pay | Admitting: Certified Nurse Midwife

## 2018-03-12 VITALS — BP 128/76 | HR 86 | Wt 177.0 lb

## 2018-03-12 DIAGNOSIS — O98812 Other maternal infectious and parasitic diseases complicating pregnancy, second trimester: Principal | ICD-10-CM

## 2018-03-12 DIAGNOSIS — Z3400 Encounter for supervision of normal first pregnancy, unspecified trimester: Secondary | ICD-10-CM

## 2018-03-12 DIAGNOSIS — Z113 Encounter for screening for infections with a predominantly sexual mode of transmission: Secondary | ICD-10-CM

## 2018-03-12 DIAGNOSIS — O98813 Other maternal infectious and parasitic diseases complicating pregnancy, third trimester: Secondary | ICD-10-CM

## 2018-03-12 DIAGNOSIS — Z3A36 36 weeks gestation of pregnancy: Secondary | ICD-10-CM

## 2018-03-12 DIAGNOSIS — A749 Chlamydial infection, unspecified: Secondary | ICD-10-CM

## 2018-03-12 NOTE — Progress Notes (Signed)
   PRENATAL VISIT NOTE  Subjective:  Susan Anthony is a 23 y.o. G1P0000 at [redacted]w[redacted]d being seen today for ongoing prenatal care.  She is currently monitored for the following issues for this low-risk pregnancy and has Supervision of normal first pregnancy, antepartum and Chlamydia infection affecting pregnancy on their problem list.  Patient reports no complaints.  Contractions: Irregular. Vag. Bleeding: None.  Movement: Present. Denies leaking of fluid.   The following portions of the patient's history were reviewed and updated as appropriate: allergies, current medications, past family history, past medical history, past social history, past surgical history and problem list. Problem list updated.  Objective:   Vitals:   03/12/18 1454  BP: 128/76  Pulse: 86  Weight: 177 lb (80.3 kg)    Fetal Status: Fetal Heart Rate (bpm): 146 Fundal Height: 35 cm Movement: Present  Presentation: Vertex  General:  Alert, oriented and cooperative. Patient is in no acute distress.  Skin: Skin is warm and dry. No rash noted.   Cardiovascular: Normal heart rate noted  Respiratory: Normal respiratory effort, no problems with respiration noted  Abdomen: Soft, gravid, appropriate for gestational age.  Pain/Pressure: Present     Pelvic: Cervical exam performed Dilation: 2 Effacement (%): 70 Station: -2  Extremities: Normal range of motion.  Edema: None  Mental Status: Normal mood and affect. Normal behavior. Normal judgment and thought content.   Assessment and Plan:  Pregnancy: G1P0000 at [redacted]w[redacted]d  1. Supervision of normal first pregnancy, antepartum - Patient doing well, no complaints - Anticipatory guidance on upcoming appointments - Labor precautions and reasons to go to MAU discussed  - Educated on move date and if she goes into labor prior to 2/23 go to [facility] if after or on day of 2/23 she needs to present to [facility], patient verbalizes understanding - Strep Gp B NAA - GC/Chlamydia probe amp  ([place])not at [facility]  Preterm labor symptoms and general obstetric precautions including but not limited to vaginal bleeding, contractions, leaking of fluid and fetal movement were reviewed in detail with the patient. Please refer to After Visit Summary for other counseling recommendations.  Return in about 1 week (around 03/19/2018) for ROB.  Future Appointments  Date Time Provider Department Center  03/19/2018  1:15 PM Sharyon Cable, CNM [facility] None    Sharyon Cable, [place]

## 2018-03-12 NOTE — Patient Instructions (Signed)
Reasons to go to MAU:  1.  Contractions are  5 minutes apart or less, each last 1 minute, these have been going on for 1-2 hours, and you cannot walk or talk during them 2.  You have a large gush of fluid, or a trickle of fluid that will not stop and you have to wear a pad 3.  You have bleeding that is bright red, heavier than spotting--like menstrual bleeding (spotting can be normal in early labor or after a check of your cervix) 4.  You do not feel the baby moving like he/she normally does  

## 2018-03-13 LAB — GC/CHLAMYDIA PROBE AMP (~~LOC~~) NOT AT ARMC
Chlamydia: POSITIVE — AB
Neisseria Gonorrhea: NEGATIVE

## 2018-03-14 LAB — STREP GP B NAA: Strep Gp B NAA: POSITIVE — AB

## 2018-03-14 MED ORDER — AZITHROMYCIN 500 MG PO TABS: 1000.0000 mg | ORAL_TABLET | Freq: Once | ORAL | 0 refills | Status: AC

## 2018-03-14 NOTE — Addendum Note (Signed)
Addended by: Sharyon Cable on: 03/14/2018 07:46 AM   Modules accepted: Orders

## 2018-03-15 ENCOUNTER — Telehealth: Payer: Self-pay | Admitting: Certified Nurse Midwife

## 2018-03-15 ENCOUNTER — Telehealth: Payer: Self-pay | Admitting: Obstetrics and Gynecology

## 2018-03-15 ENCOUNTER — Telehealth: Payer: Self-pay | Admitting: Advanced Practice Midwife

## 2018-03-15 NOTE — Telephone Encounter (Signed)
Called with medication questions. Has 1000 mg Azithromycin left over from Rx given in 09/2017. Wants to know if she can take that instead of picking up new Rx. Instructed her that she could. Is concerned that it may not be effective because she took 1000 mg in 09/2017 and wonders why she has it again. Explained that her partner may have not ABX and reinfected her or may have been reinfected and reinfected her. Treatment failure is also rare, but a possibility. Recommend she a partner be treated. Pt asked if Chlamydia infection makes vaginal birth contraindicated and I assured her that it was not.

## 2018-03-15 NOTE — Telephone Encounter (Signed)
Notified of (+) CT results and Rx at [facility] needs to be picked up and taken as prescribed. Discussed the importance of getting treated and retested prior to delivery. Advised that partner be treated as well. Patient has no questions at this time. Patient verbalized an understanding of the plan of care and agrees.   Raelyn Mora, CNM

## 2018-03-15 NOTE — Telephone Encounter (Signed)
Susan Anthony tested positive for  Chlamydia. Patient was called by CNM. Mother answered patient's phone and stated patient was unable to talk at this and will call back.   Rx sent to pharmacy on file   Mcneil Sober 03/15/2018 7:56 AM

## 2018-03-18 ENCOUNTER — Encounter (HOSPITAL_COMMUNITY): Payer: Self-pay

## 2018-03-18 ENCOUNTER — Other Ambulatory Visit: Payer: Self-pay

## 2018-03-18 ENCOUNTER — Inpatient Hospital Stay (HOSPITAL_COMMUNITY)
Admission: AD | Admit: 2018-03-18 | Discharge: 2018-03-19 | Disposition: A | Discharge status: Home / Self Care | Payer: Self-pay | Attending: Family Medicine | Admitting: Family Medicine

## 2018-03-18 DIAGNOSIS — N898 Other specified noninflammatory disorders of vagina: Secondary | ICD-10-CM | POA: Insufficient documentation

## 2018-03-18 DIAGNOSIS — Z3A37 37 weeks gestation of pregnancy: Secondary | ICD-10-CM | POA: Insufficient documentation

## 2018-03-18 DIAGNOSIS — O26893 Other specified pregnancy related conditions, third trimester: Secondary | ICD-10-CM | POA: Insufficient documentation

## 2018-03-18 DIAGNOSIS — O479 False labor, unspecified: Secondary | ICD-10-CM

## 2018-03-18 LAB — POCT FERN TEST: POCT Fern Test: NEGATIVE

## 2018-03-18 NOTE — MAU Provider Note (Signed)
History   [id number]   Chief Complaint  Patient presents with  . Contractions    HPI Susan Anthony is a 23 y.o. female  G1P0000 here with report of watery vaginal discharge that began at approximately 2200hrs.  Leaking of fluid has not continued.  Pt reports contractions and denies vaginal bleeding.    +fetal movement.   All other systems negative.    RN Note: Presents with cxts starting around 5:30p today; approx every 8 min. Pain 5-6/10. Dec FM. Pt with watery discharge around 10:00p tonight. Denies vaginal bldg.  Patient's last menstrual period was 06/29/2017.  OB History  Gravida Para Term Preterm AB Living  1 0 0 0 0    SAB TAB Ectopic Multiple Live Births  0 0 0        # Outcome Date GA Lbr Len/2nd Weight Sex Delivery Anes PTL Lv  1 Current             Past Medical History:  Diagnosis Date  . Medical history non-contributory     Family History  Problem Relation Age of Onset  . Diabetes Maternal Grandmother   . Hypertension Maternal Grandmother   . Hyperlipidemia Maternal Grandmother   . Diabetes Paternal Grandmother   . Hypertension Paternal Grandmother   . Hyperlipidemia Paternal Grandmother     Social History   Socioeconomic History  . Marital status: Single    Spouse name: Not on file  . Number of children: Not on file  . Years of education: Not on file  . Highest education level: Not on file  Occupational History  . Not on file  Social Needs  . Financial resource strain: Not on file  . Food insecurity:    Worry: Not on file    Inability: Not on file  . Transportation needs:    Medical: Not on file    Non-medical: Not on file  Tobacco Use  . Smoking status: Never Smoker  . Smokeless tobacco: Never Used  Substance and Sexual Activity  . Alcohol use: Not Currently    Frequency: Never  . Drug use: Never  . Sexual activity: Not Currently  Lifestyle  . Physical activity:    Days per week: Not on file    Minutes per session:  Not on file  . Stress: Not on file  Relationships  . Social connections:    Talks on phone: Not on file    Gets together: Not on file    Attends religious service: Not on file    Active member of club or organization: Not on file    Attends meetings of clubs or organizations: Not on file    Relationship status: Not on file  Other Topics Concern  . Not on file  Social History Narrative   ** Merged History Encounter **        No Known Allergies  No current facility-administered medications on file prior to encounter.    Current Outpatient Medications on File Prior to Encounter  Medication Sig Dispense Refill  . prenatal vitamin w/FE, FA (PRENATAL 1 + 1) 27-1 MG TABS tablet Take 1 tablet by mouth daily at 12 noon.       Review of Systems  Constitutional: Negative for chills and fever.  Respiratory: Negative for shortness of breath.   Gastrointestinal: Positive for abdominal pain. Negative for constipation, diarrhea, nausea and vomiting.  Genitourinary: Positive for vaginal discharge. Negative for vaginal bleeding.    Physical Exam   Vitals:  03/18/18 2223 03/18/18 2244 03/18/18 2300  BP: (!) 141/93 (!) 125/91 124/84  Pulse: 84 79 82  Resp: 18    Temp: 98.3 F (36.8 C)    TempSrc: Oral    SpO2: 96%    Weight: 79.4 kg      Physical Exam  Constitutional: She is oriented to person, place, and time. She appears well-developed and well-nourished. No distress.  HENT:  Head: Normocephalic.  Cardiovascular: Normal rate and regular rhythm.  Respiratory: Effort normal. No respiratory distress.  GI: Soft. There is no abdominal tenderness. There is no rebound and no guarding.  Genitourinary:    Vaginal discharge present.     Genitourinary Comments: SSE:  Thick white discharge, no pooling, no ferning   Musculoskeletal: Normal range of motion.  Neurological: She is alert and oriented to person, place, and time.  Skin: Skin is warm and dry.  Psychiatric: She has a normal  mood and affect.  FHR reactive, average variability, + accelerations UCs irregular Dilation: 2.5 Effacement (%): 70(70-80) Cervical Position: Middle Station: -1 Presentation: Vertex Exam by:: Adah Perl RN  MAU Course  Procedures  MDM Negative exam for ruptured membranes Will walk for an hour and recheck cervix, doubt labor  Assessment and Plan   SIUP at @[redacted]w[redacted]d @ Vaginal discharge, no evidence of ROM Not in labor but more effaced  Discharge home Labor precautions Follow up in office or in MAU PRN    Aviva Signs, CNM 03/18/2018 11:09 PM

## 2018-03-18 NOTE — MAU Note (Addendum)
Presents with cxts starting around 5:30p today; approx every 8 min. Pain 5-6/10. Dec FM. Pt with watery discharge around 10:00p tonight. Denies vaginal bldg.   Adah Perl RN

## 2018-03-19 ENCOUNTER — Encounter: Payer: Self-pay | Admitting: Certified Nurse Midwife

## 2018-03-19 ENCOUNTER — Ambulatory Visit (INDEPENDENT_AMBULATORY_CARE_PROVIDER_SITE_OTHER): Payer: Self-pay | Admitting: Certified Nurse Midwife

## 2018-03-19 VITALS — BP 132/82 | HR 97 | Wt 177.4 lb

## 2018-03-19 DIAGNOSIS — Z3403 Encounter for supervision of normal first pregnancy, third trimester: Secondary | ICD-10-CM

## 2018-03-19 DIAGNOSIS — A749 Chlamydial infection, unspecified: Secondary | ICD-10-CM

## 2018-03-19 DIAGNOSIS — Z3A37 37 weeks gestation of pregnancy: Secondary | ICD-10-CM

## 2018-03-19 DIAGNOSIS — O98813 Other maternal infectious and parasitic diseases complicating pregnancy, third trimester: Secondary | ICD-10-CM

## 2018-03-19 DIAGNOSIS — Z3400 Encounter for supervision of normal first pregnancy, unspecified trimester: Secondary | ICD-10-CM

## 2018-03-19 NOTE — Patient Instructions (Signed)
Reasons to return to MAU: ° °1.  Contractions are  3-4 minutes apart or less, each last 1 minute, these have been going on for 1-2 hours, and you cannot walk or talk during them °2.  You have a large gush of fluid, or a trickle of fluid that will not stop and you have to wear a pad °3.  You have bleeding that is bright red, heavier than spotting--like menstrual bleeding (spotting can be normal in early labor or after a check of your cervix) °4.  You do not feel the baby moving like he/she normally does ° °

## 2018-03-19 NOTE — Progress Notes (Signed)
   PRENATAL VISIT NOTE  Subjective:  Susan Anthony is a 23 y.o. G1P0000 at [redacted]w[redacted]d being seen today for ongoing prenatal care.  She is currently monitored for the following issues for this low-risk pregnancy and has Supervision of normal first pregnancy, antepartum and Chlamydia infection affecting pregnancy on their problem list.  Patient reports contractions every 9-10 minutes.  Contractions: Irregular. Vag. Bleeding: None.  Movement: Present. Denies leaking of fluid.   The following portions of the patient's history were reviewed and updated as appropriate: allergies, current medications, past family history, past medical history, past social history, past surgical history and problem list. Problem list updated.  Objective:   Vitals:   03/19/18 1333  BP: 132/82  Pulse: 97  Weight: 177 lb 6.4 oz (80.5 kg)    Fetal Status: Fetal Heart Rate (bpm): 135 Fundal Height: 35 cm Movement: Present  Presentation: Vertex  General:  Alert, oriented and cooperative. Patient is in no acute distress.  Skin: Skin is warm and dry. No rash noted.   Cardiovascular: Normal heart rate noted  Respiratory: Normal respiratory effort, no problems with respiration noted  Abdomen: Soft, gravid, appropriate for gestational age.  Pain/Pressure: Present     Pelvic: Cervical exam performed Dilation: 2 Effacement (%): 80 Station: -2  Extremities: Normal range of motion.  Edema: None  Mental Status: Normal mood and affect. Normal behavior. Normal judgment and thought content.   Assessment and Plan:  Pregnancy: G1P0000 at [redacted]w[redacted]d  1. Supervision of normal first pregnancy, antepartum - Mild contractions by palpation, patient request cervical examination  - Labor precautions discussed and reasons to present to MAU  - Discussed painful contractions that occur every 5 minutes for at least 2 hours where she is unable to talk through contractions, patient verbalizes understanding  - Routine prenatal care     2. Chlamydia infection affecting pregnancy in third trimester - Patient completed medication on Sunday 03/15/2018- needs TOC prior to delivery, discussed recommendation of erythromycin for baby after delivery   Term labor symptoms and general obstetric precautions including but not limited to vaginal bleeding, contractions, leaking of fluid and fetal movement were reviewed in detail with the patient. Please refer to After Visit Summary for other counseling recommendations.  Return in about 1 week (around 03/26/2018) for ROB.  Future Appointments  Date Time Provider Department Center  03/26/2018 11:15 AM Sharyon Cable, CNM [facility] None  04/02/2018  1:45 PM Sharyon Cable, CNM [facility] None    Sharyon Cable, CNM

## 2018-03-19 NOTE — Discharge Instructions (Signed)

## 2018-03-19 NOTE — Progress Notes (Signed)
Patient reports fetal movement with irregular contractions. Pt declined flu, and states that she does not know if she wants to get tdap.

## 2018-03-23 ENCOUNTER — Encounter (HOSPITAL_COMMUNITY): Payer: Self-pay | Admitting: *Deleted

## 2018-03-23 ENCOUNTER — Inpatient Hospital Stay (HOSPITAL_COMMUNITY)
Admission: AD | Admit: 2018-03-23 | Discharge: 2018-03-24 | Disposition: A | Discharge status: Home / Self Care | Payer: Self-pay | Attending: Obstetrics and Gynecology | Admitting: Obstetrics and Gynecology

## 2018-03-23 DIAGNOSIS — O471 False labor at or after 37 completed weeks of gestation: Secondary | ICD-10-CM | POA: Insufficient documentation

## 2018-03-23 DIAGNOSIS — O479 False labor, unspecified: Secondary | ICD-10-CM

## 2018-03-23 DIAGNOSIS — Z3A38 38 weeks gestation of pregnancy: Secondary | ICD-10-CM

## 2018-03-23 DIAGNOSIS — Z3689 Encounter for other specified antenatal screening: Secondary | ICD-10-CM

## 2018-03-23 MED ORDER — NALBUPHINE HCL 10 MG/ML IJ SOLN
10.0000 mg | Freq: Once | INTRAMUSCULAR | Status: AC
  Administered 2018-03-24: 10 mg via INTRAMUSCULAR
  Filled 2018-03-23: qty 1

## 2018-03-23 NOTE — MAU Note (Signed)
2220: went to the bedside after seeing the FHR going down and pt was standing beside her bed. Pt put back in bed and turned to her left lateral side.

## 2018-03-23 NOTE — MAU Note (Signed)
PT SAYS UC STRONG  6 PM.    WENT WALKING- FEELS PRESSURE .

## 2018-03-24 DIAGNOSIS — Z3A38 38 weeks gestation of pregnancy: Secondary | ICD-10-CM

## 2018-03-24 DIAGNOSIS — Z3689 Encounter for other specified antenatal screening: Secondary | ICD-10-CM

## 2018-03-24 DIAGNOSIS — O479 False labor, unspecified: Secondary | ICD-10-CM

## 2018-03-24 NOTE — Discharge Instructions (Signed)

## 2018-03-24 NOTE — MAU Provider Note (Addendum)
First Provider Initiated Contact with Patient 03/23/18 2345     S: Ms. Susan Anthony is a 23 y.o. G1P0000 at [redacted]w[redacted]d  who presents to MAU today complaining contractions q 10 minutes since 1800. She denies vaginal bleeding. She denies LOF. She reports normal fetal movement.    O: BP 131/80 (BP Location: Right Arm)   Pulse 92   Temp 98.9 F (37.2 C) (Oral)   Ht 5\' 1"  (1.549 m)   Wt 80.6 kg   LMP 06/29/2017   BMI 33.56 kg/m  GENERAL: Well-developed, well-nourished female in no acute distress.  HEAD: Normocephalic, atraumatic.  CHEST: Normal effort of breathing, regular heart rate ABDOMEN: Soft, nontender, gravid  Cervical exam:  Dilation: 4 Effacement (%): 100 Cervical Position: Middle Station: -2 Presentation: Vertex Exam by:: K. WeissRN   Fetal Monitoring: Baseline: 115 Variability: moderate Accelerations: present  Decelerations: 1 variable deceleration while patient standing otherwise none Contractions: 7-8 minutes/ mild by palpation    A: SIUP at [redacted]w[redacted]d  False labor NST reactive   P: Discharge home Nubain 10mg  IM given prior to discharge home Follow up as scheduled in the office on Thursday  Return to MAU as needed  Sharyon Cable, CNM 03/24/2018 1:33 AM

## 2018-03-25 ENCOUNTER — Encounter (HOSPITAL_COMMUNITY): Payer: Self-pay | Admitting: *Deleted

## 2018-03-25 ENCOUNTER — Other Ambulatory Visit: Payer: Self-pay

## 2018-03-25 ENCOUNTER — Inpatient Hospital Stay (HOSPITAL_COMMUNITY)
Admission: AD | Admit: 2018-03-25 | Discharge: 2018-03-27 | DRG: 807 | Disposition: A | Discharge status: Home / Self Care | Payer: Medicaid Other | Attending: Obstetrics and Gynecology | Admitting: Obstetrics and Gynecology

## 2018-03-25 DIAGNOSIS — O98819 Other maternal infectious and parasitic diseases complicating pregnancy, unspecified trimester: Secondary | ICD-10-CM

## 2018-03-25 DIAGNOSIS — O26893 Other specified pregnancy related conditions, third trimester: Secondary | ICD-10-CM | POA: Diagnosis present

## 2018-03-25 DIAGNOSIS — A749 Chlamydial infection, unspecified: Secondary | ICD-10-CM | POA: Diagnosis present

## 2018-03-25 DIAGNOSIS — Z3A38 38 weeks gestation of pregnancy: Secondary | ICD-10-CM

## 2018-03-25 DIAGNOSIS — O99824 Streptococcus B carrier state complicating childbirth: Principal | ICD-10-CM | POA: Diagnosis present

## 2018-03-25 LAB — TYPE AND SCREEN
ABO/RH(D): O POS
Antibody Screen: NEGATIVE

## 2018-03-25 LAB — CBC
HCT: 31.4 % — ABNORMAL LOW (ref 36.0–46.0)
Hemoglobin: 9.7 g/dL — ABNORMAL LOW (ref 12.0–15.0)
MCH: 26.8 pg (ref 26.0–34.0)
MCHC: 30.9 g/dL (ref 30.0–36.0)
MCV: 86.7 fL (ref 80.0–100.0)
Platelets: 140 10*3/uL — ABNORMAL LOW (ref 150–400)
RBC: 3.62 MIL/uL — ABNORMAL LOW (ref 3.87–5.11)
RDW: 13.1 % (ref 11.5–15.5)
WBC: 7.8 10*3/uL (ref 4.0–10.5)
nRBC: 0 % (ref 0.0–0.2)

## 2018-03-25 LAB — RPR: RPR Ser Ql: NONREACTIVE

## 2018-03-25 LAB — ABO/RH: ABO/RH(D): O POS

## 2018-03-25 MED ORDER — OXYCODONE-ACETAMINOPHEN 5-325 MG PO TABS: 2.0000 | ORAL_TABLET | ORAL | Status: DC | PRN

## 2018-03-25 MED ORDER — OXYTOCIN BOLUS FROM INFUSION
500.0000 mL | Freq: Once | INTRAVENOUS | Status: AC
  Administered 2018-03-25: 500 mL via INTRAVENOUS

## 2018-03-25 MED ORDER — DIPHENHYDRAMINE HCL 25 MG PO CAPS: 25.0000 mg | ORAL_CAPSULE | Freq: Four times a day (QID) | ORAL | Status: DC | PRN

## 2018-03-25 MED ORDER — FENTANYL CITRATE (PF) 100 MCG/2ML IJ SOLN
50.0000 ug | INTRAMUSCULAR | Status: DC | PRN
  Administered 2018-03-25 (×4): 100 ug via INTRAVENOUS
  Administered 2018-03-25: 50 ug via INTRAVENOUS
  Administered 2018-03-25 (×2): 100 ug via INTRAVENOUS
  Filled 2018-03-25 (×6): qty 2

## 2018-03-25 MED ORDER — IBUPROFEN 600 MG PO TABS
600.0000 mg | ORAL_TABLET | Freq: Four times a day (QID) | ORAL | Status: DC
  Administered 2018-03-25 – 2018-03-27 (×7): 600 mg via ORAL
  Filled 2018-03-25 (×7): qty 1

## 2018-03-25 MED ORDER — DIBUCAINE 1 % RE OINT: 1.0000 "application " | TOPICAL_OINTMENT | RECTAL | Status: DC | PRN

## 2018-03-25 MED ORDER — ZOLPIDEM TARTRATE 5 MG PO TABS: 5.0000 mg | ORAL_TABLET | Freq: Every evening | ORAL | Status: DC | PRN

## 2018-03-25 MED ORDER — ONDANSETRON HCL 4 MG/2ML IJ SOLN: 4.0000 mg | Freq: Four times a day (QID) | INTRAMUSCULAR | Status: DC | PRN

## 2018-03-25 MED ORDER — PRENATAL MULTIVITAMIN CH
1.0000 | ORAL_TABLET | Freq: Every day | ORAL | Status: DC
  Administered 2018-03-26: 1 via ORAL
  Filled 2018-03-25: qty 1

## 2018-03-25 MED ORDER — ACETAMINOPHEN 325 MG PO TABS
650.0000 mg | ORAL_TABLET | ORAL | Status: DC | PRN
  Administered 2018-03-25 – 2018-03-26 (×2): 650 mg via ORAL
  Filled 2018-03-25 (×2): qty 2

## 2018-03-25 MED ORDER — LACTATED RINGERS IV SOLN
INTRAVENOUS | Status: DC
  Administered 2018-03-25 (×2): via INTRAVENOUS

## 2018-03-25 MED ORDER — SOD CITRATE-CITRIC ACID 500-334 MG/5ML PO SOLN: 30.0000 mL | ORAL | Status: DC | PRN

## 2018-03-25 MED ORDER — PENICILLIN G 3 MILLION UNITS IVPB - SIMPLE MED
3.0000 10*6.[IU] | INTRAVENOUS | Status: DC
  Administered 2018-03-25 (×2): 3 10*6.[IU] via INTRAVENOUS
  Filled 2018-03-25 (×2): qty 100

## 2018-03-25 MED ORDER — COCONUT OIL OIL: 1.0000 "application " | TOPICAL_OIL | Status: DC | PRN

## 2018-03-25 MED ORDER — LACTATED RINGERS IV SOLN: 500.0000 mL | INTRAVENOUS | Status: DC | PRN

## 2018-03-25 MED ORDER — WITCH HAZEL-GLYCERIN EX PADS: 1.0000 "application " | MEDICATED_PAD | CUTANEOUS | Status: DC | PRN

## 2018-03-25 MED ORDER — SIMETHICONE 80 MG PO CHEW: 80.0000 mg | CHEWABLE_TABLET | ORAL | Status: DC | PRN

## 2018-03-25 MED ORDER — PENICILLIN G POTASSIUM 5000000 UNITS IJ SOLR
5.0000 10*6.[IU] | Freq: Once | INTRAMUSCULAR | Status: AC
  Administered 2018-03-25: 5 10*6.[IU] via INTRAVENOUS
  Filled 2018-03-25: qty 5

## 2018-03-25 MED ORDER — OXYCODONE-ACETAMINOPHEN 5-325 MG PO TABS: 1.0000 | ORAL_TABLET | ORAL | Status: DC | PRN

## 2018-03-25 MED ORDER — ONDANSETRON HCL 4 MG PO TABS: 4.0000 mg | ORAL_TABLET | ORAL | Status: DC | PRN

## 2018-03-25 MED ORDER — FENTANYL CITRATE (PF) 100 MCG/2ML IJ SOLN
INTRAMUSCULAR | Status: AC
  Filled 2018-03-25: qty 2

## 2018-03-25 MED ORDER — TETANUS-DIPHTH-ACELL PERTUSSIS 5-2.5-18.5 LF-MCG/0.5 IM SUSP
0.5000 mL | Freq: Once | INTRAMUSCULAR | Status: AC
  Administered 2018-03-26: 0.5 mL via INTRAMUSCULAR
  Filled 2018-03-25: qty 0.5

## 2018-03-25 MED ORDER — SENNOSIDES-DOCUSATE SODIUM 8.6-50 MG PO TABS
2.0000 | ORAL_TABLET | ORAL | Status: DC
  Administered 2018-03-26 (×2): 2 via ORAL
  Filled 2018-03-25 (×2): qty 2

## 2018-03-25 MED ORDER — LIDOCAINE HCL (PF) 1 % IJ SOLN
30.0000 mL | INTRAMUSCULAR | Status: AC | PRN
  Administered 2018-03-25: 30 mL via SUBCUTANEOUS
  Filled 2018-03-25: qty 30

## 2018-03-25 MED ORDER — ONDANSETRON HCL 4 MG/2ML IJ SOLN: 4.0000 mg | INTRAMUSCULAR | Status: DC | PRN

## 2018-03-25 MED ORDER — BENZOCAINE-MENTHOL 20-0.5 % EX AERO
1.0000 "application " | INHALATION_SPRAY | CUTANEOUS | Status: DC | PRN
  Filled 2018-03-25: qty 56

## 2018-03-25 MED ORDER — OXYTOCIN 40 UNITS IN NORMAL SALINE INFUSION - SIMPLE MED
2.5000 [IU]/h | INTRAVENOUS | Status: DC
  Filled 2018-03-25: qty 1000

## 2018-03-25 MED ORDER — ACETAMINOPHEN 325 MG PO TABS: 650.0000 mg | ORAL_TABLET | ORAL | Status: DC | PRN

## 2018-03-25 NOTE — H&P (Addendum)
LABOR AND DELIVERY ADMISSION HISTORY AND PHYSICAL NOTE  Susan Anthony is a 23 y.o. female G1P0000 with IUP at [redacted]w[redacted]d by LMP presenting for SOL.  She reports positive fetal movement. She denies leakage of fluid or vaginal bleeding. She tested positive on 2/14 for Chlamydia and confirms she picked up and took Azithromycin and her partner did also.  Prenatal History/Complications: PNC at [facility] Pregnancy complications:  - Chlamydia positive on 10/14, TOC negative, positive on 2/14  Past Medical History: Past Medical History:  Diagnosis Date  . Medical history non-contributory     Past Surgical History: Past Surgical History:  Procedure Laterality Date  . NO PAST SURGERIES      Obstetrical History: OB History    Gravida  1   Para  0   Term  0   Preterm  0   AB  0   Living        SAB  0   TAB  0   Ectopic  0   Multiple      Live Births              Social History: Social History   Socioeconomic History  . Marital status: Single    Spouse name: Not on file  . Number of children: Not on file  . Years of education: Not on file  . Highest education level: Not on file  Occupational History  . Not on file  Social Needs  . Financial resource strain: Not on file  . Food insecurity:    Worry: Not on file    Inability: Not on file  . Transportation needs:    Medical: Not on file    Non-medical: Not on file  Tobacco Use  . Smoking status: Never Smoker  . Smokeless tobacco: Never Used  Substance and Sexual Activity  . Alcohol use: Not Currently    Frequency: Never  . Drug use: Never  . Sexual activity: Not Currently  Lifestyle  . Physical activity:    Days per week: Not on file    Minutes per session: Not on file  . Stress: Not on file  Relationships  . Social connections:    Talks on phone: Not on file    Gets together: Not on file    Attends religious service: Not on file    Active member of club or organization: Not on file     Attends meetings of clubs or organizations: Not on file    Relationship status: Not on file  Other Topics Concern  . Not on file  Social History Narrative   ** Merged History Encounter **        Family History: Family History  Problem Relation Age of Onset  . Diabetes Maternal Grandmother   . Hypertension Maternal Grandmother   . Hyperlipidemia Maternal Grandmother   . Diabetes Paternal Grandmother   . Hypertension Paternal Grandmother   . Hyperlipidemia Paternal Grandmother     Allergies: No Known Allergies  Medications Prior to Admission  Medication Sig Dispense Refill Last Dose  . prenatal vitamin w/FE, FA (PRENATAL 1 + 1) 27-1 MG TABS tablet Take 1 tablet by mouth daily at 12 noon.   03/23/2018 at Unknown time   Review of Systems  All systems reviewed and negative except as stated in HPI  Physical Exam Last menstrual period 06/29/2017. General appearance: alert, cooperative and no distress Lungs: normal work of breathing Abdomen: gravid Extremities: No calf swelling or tenderness Presentation: cephalic Fetal  monitoring: Cat 1 Uterine activity: irregular 4-5 min Dilation: 5.5 Effacement (%): 100 Station: -2 Exam by:: Cisco RN   Prenatal labs: ABO, Rh: O/Positive/-- (09/16 1105) Antibody: Negative (09/16 1105) Rubella: 2.98 (09/16 1105) RPR: Non Reactive (12/12 1127)  HBsAg: Negative (09/16 1105)  HIV: Non Reactive (12/12 1127)  GC/Chlamydia: GC: negative; Chlamydia: positive GBS: Positive (02/13 1654)  1 hr Glucola: normal Genetic screening:  Anatomy US: normal, available via media section; scan from [place]  Prenatal Transfer Tool  Maternal Diabetes: No Genetic Screening: Normal Maternal Ultrasounds/Referrals: Normal Fetal Ultrasounds or other Referrals:  None Maternal Substance Abuse:  No Significant Maternal Medications:  None Significant Maternal Lab Results: None  No results found for this or any previous visit (from the past 24  hour(s)).  Patient Active Problem List   Diagnosis Date Noted  . Chlamydia infection affecting pregnancy 12/11/2017  . Supervision of normal first pregnancy, antepartum 10/13/2017    Assessment: Susan Anthony is a 23 y.o. G1P0000 at [redacted]w[redacted]d here for   #Labor: expectant management #Pain: Analgesic/anesethia; epidural upon request #FWB: Cat 1 #ID: GBS Positive, PCN; Chlaymdia positive on 2/14 - patient confirms she picked up and took Azithromycin 1000mg  #MOF: Breast #MOC: IUD #Circ: N/A, girl  [name], PGY-2 03/25/2018, 2:35 AM   I spoke with and examined patient and agree with resident/PA-S/MS/SNM's note and plan of care.  [name], CNM, [facility] 03/25/2018 9:01 AM

## 2018-03-25 NOTE — Discharge Summary (Signed)
Postpartum Discharge Summary    Patient Name: Susan Anthony DOB: 1995/06/13 MRN: [id number]  Date of admission: 03/25/2018 Delivering Provider: Sharyon Cable   Date of discharge: 03/27/2018  Admitting diagnosis: 38WKS CTX Intrauterine pregnancy: [redacted]w[redacted]d     Secondary diagnosis:  Active Problems:   Chlamydia infection affecting pregnancy   Normal labor and delivery   SVD (spontaneous vaginal delivery)  Additional problems: Chlamydia infection during pregnancy (negative test of cure on admission)     Discharge diagnosis: Term Pregnancy Delivered                                                      Post partum procedures:n/a Augmentation: AROM Complications: None  Hospital course:  Onset of Labor With Vaginal Delivery     23 y.o. yo G1P0000 at [redacted]w[redacted]d was admitted in Active Labor on 03/25/2018. Patient had an uncomplicated labor course as follows: Membrane Rupture Time/Date: 11:48 AM ,03/25/2018 . Intrapartum Procedures: Episiotomy: None [1] . Lacerations:  Sulcus [9];Labial [10]  Patient had a delivery of a Viable infant. Patient had an uncomplicated postpartum course.  She is ambulating, tolerating a regular diet, passing flatus, and urinating well. Patient is discharged home in stable condition on 03/27/18.  Magnesium Sulfate recieved: No BMZ received: No  Physical exam  Vitals:   03/26/18 0030 03/26/18 0500 03/26/18 2242 03/27/18 0540  BP: 114/64 123/81 112/72 114/82  Pulse: 81 67 68 71  Resp: 16 18 16 16   Temp: 97.8 F (36.6 C) 98.4 F (36.9 C) 98.6 F (37 C) 97.8 F (36.6 C)  TempSrc: Oral Oral Oral Oral  SpO2:   100% 100%   General: alert, cooperative and no distress Lochia: appropriate Uterine Fundus: firm Incision: N/A DVT Evaluation: No significant calf/ankle edema. Labs: Lab Results  Component Value Date   WBC 7.8 03/25/2018   HGB 9.7 (L) 03/25/2018   HCT 31.4 (L) 03/25/2018   MCV 86.7 03/25/2018   PLT 140 (L) 03/25/2018   No flowsheet  data found.  Discharge instruction: per After Visit Summary and "Baby and Me Booklet".  After visit meds:  Allergies as of 03/27/2018   No Known Allergies     Medication List    TAKE these medications   ibuprofen 800 MG tablet Commonly known as:  ADVIL,MOTRIN Take 1 tablet (800 mg total) by mouth 3 (three) times daily.   prenatal vitamin w/FE, FA 27-1 MG Tabs tablet Take 1 tablet by mouth daily at 12 noon.   senna-docusate 8.6-50 MG tablet Commonly known as:  Senokot-S Take 2 tablets by mouth at bedtime as needed for mild constipation.       Diet: routine diet  Activity: Advance as tolerated. Pelvic rest for 6 weeks.   Outpatient follow up:4 weeks Follow up Appt: Future Appointments  Date Time Provider Department Center  04/23/2018  2:15 PM Sharyon Cable, CNM [facility] None   Follow up Visit: Follow-up Information    [facility]. Schedule an appointment as soon as possible for a visit.   Specialty:  Obstetrics and Gynecology Why:  You should receive a call to schedule a postpartum follow-up. If you do not, please call clinic to make appointment in 4-6 weeks. Contact information: [address], [address] [place] [address] [phone]  Please schedule this patient for Postpartum visit in: 4 weeks with the following provider: APP For C/S patients schedule nurse incision check in weeks 2 weeks: no Low risk pregnancy complicated by: nothing Delivery mode:  SVD Anticipated Birth Control:  IUD PP Procedures needed: IUD insertion  Schedule Integrated BH visit: no  Newborn Data: Live born female  Birth Weight:  2770gm APGAR: 9, 9  Newborn Delivery   Birth date/time:  03/25/2018 13:20:00 Delivery type:  Vaginal, Spontaneous    Baby Feeding: Breast Disposition:home with mother  03/27/2018 Susan Anthony, CNM  10:19 AM

## 2018-03-25 NOTE — Anesthesia Pain Management Evaluation Note (Signed)
  CRNA Pain Management Visit Note  Patient: Susan Anthony, 23 y.o., female  "Hello I am a member of the anesthesia team at [facility]. We have an anesthesia team available at all times to provide care throughout the hospital, including epidural management and anesthesia for C-section. I don't know your plan for the delivery whether it a natural birth, water birth, IV sedation, nitrous supplementation, doula or epidural, but we want to meet your pain goals."   1.Was your pain managed to your expectations on prior hospitalizations?   Yes   2.What is your expectation for pain management during this hospitalization?     IV pain meds  3.How can we help you reach that goal? Nursing support  Record the patient's initial score and the patient's pain goal.   Pain: 8/10  Pain Goal: 10/10 [facility] wants you to be able to say your pain was always managed very well.  Salome Arnt 03/25/2018

## 2018-03-25 NOTE — Progress Notes (Signed)
Esabella Del Alilyana Tolman is a 23 y.o. G1P0000 at [redacted]w[redacted]d.  Subjective: Patient is resting comfortably in bed, doing well.  Objective: Temp 98.8 F (37.1 C) (Oral)   Resp 20   LMP 06/29/2017    FHT:  FHR: 115 bpm, variability: mod,  accelerations:  present,  decelerations:  absent UC:   Q 4-6 minutes, irregular Dilation: 6 Effacement (%): 100 Station: -2 Presentation: Vertex Exam by:: Irving Burton Rothermel RN  Labs: Results for orders placed or performed during the hospital encounter of 03/25/18 (from the past 24 hour(s))  Type and screen [facility]     Status: None   Collection Time: 03/25/18  2:23 AM  Result Value Ref Range   ABO/RH(D) O POS    Antibody Screen NEG    Sample Expiration      03/28/2018 Performed at [facility], [address] [place], [place] [address]   ABO/Rh     Status: None (Preliminary result)   Collection Time: 03/25/18  2:23 AM  Result Value Ref Range   ABO/RH(D)      O POS Performed at [facility], [address] [place], [place] [address]   CBC     Status: Abnormal   Collection Time: 03/25/18  2:47 AM  Result Value Ref Range   WBC 7.8 4.0 - 10.5 K/uL   RBC 3.62 (L) 3.87 - 5.11 MIL/uL   Hemoglobin 9.7 (L) 12.0 - 15.0 g/dL   HCT 86.5 (L) 78.4 - 69.6 %   MCV 86.7 80.0 - 100.0 fL   MCH 26.8 26.0 - 34.0 pg   MCHC 30.9 30.0 - 36.0 g/dL   RDW 29.5 28.4 - 13.2 %   Platelets 140 (L) 150 - 400 K/uL   nRBC 0.0 0.0 - 0.2 %    Assessment / Plan: [redacted]w[redacted]d week IUP presenting for SROM and SOL Labor: expectant management, if not adequate change consider cytotec and foley bulb Fetal Wellbeing:  Category 1 Pain Control:  Epidural on request Anticipated MOD:  SVD  [name], PGY-2 03/25/2018 6:59 AM

## 2018-03-25 NOTE — Progress Notes (Deleted)
Susan Anthony is a 23 y.o. G1P0000 at [redacted]w[redacted]d by LMP admitted for SOL  Subjective: Patient is resting in bed.  She is beginning to experience some pressure and discomfort with contractions.    Objective: BP 123/82   Pulse 70   Temp 98 F (36.7 C) (Oral)   Resp 18   LMP 06/29/2017  No intake/output data recorded. No intake/output data recorded.  FHT:  FHR: 115-120 bpm, variability: moderate,  accelerations:  Present,  decelerations:  Absent UC:  irregular SVE:   Dilation: 8 Effacement (%): 100 Station: -2, -1 Exam by:: k fields, rn  Labs: Lab Results  Component Value Date   WBC 7.8 03/25/2018   HGB 9.7 (L) 03/25/2018   HCT 31.4 (L) 03/25/2018   MCV 86.7 03/25/2018   PLT 140 (L) 03/25/2018    Assessment / Plan: Spontaneous labor, progressing normally  Labor: expectant management Fetal Wellbeing:  Category I Pain Control:  Epidural I/D:  GBS +, PCN G Anticipated MOD:  SVD  Francene Boyers 03/25/2018, 11:34 AM

## 2018-03-25 NOTE — Progress Notes (Signed)
LABOR PROGRESS NOTE  Susan Anthony is a 23 y.o. G1P0000 at [redacted]w[redacted]d  admitted for SOL  Subjective: Patient doing well, breathing through contractions   Objective: BP 123/82   Pulse 70   Temp 98 F (36.7 C) (Oral)   Resp 18   LMP 06/29/2017  or  Vitals:   03/25/18 0947 03/25/18 1020 03/25/18 1115 03/25/18 1116  BP: 116/81 113/62  123/82  Pulse: 75 85  70  Resp:  18 18   Temp:  97.7 F (36.5 C) 98 F (36.7 C)   TempSrc:  Oral Oral     AROM@1145 - clear fluid  Dilation: 8 Effacement (%): 100 Station: -2, -1 Presentation: Vertex Exam by:: k fields, rn FHT: baseline rate 110, moderate varibility, +accel, no decel Toco: 2-5/ mild-mod by palpation   Labs: Lab Results  Component Value Date   WBC 7.8 03/25/2018   HGB 9.7 (L) 03/25/2018   HCT 31.4 (L) 03/25/2018   MCV 86.7 03/25/2018   PLT 140 (L) 03/25/2018    Patient Active Problem List   Diagnosis Date Noted  . Normal labor and delivery 03/25/2018  . Chlamydia infection affecting pregnancy 12/11/2017  . Supervision of normal first pregnancy, antepartum 10/13/2017    Assessment / Plan: 23 y.o. G1P0000 at [redacted]w[redacted]d here for SOL  Labor: AROM @1145 , expectant management. GC/C swab obtained prior to AROM for hx of +CH on 2/14 with PO treatment by patient  Fetal Wellbeing:  Cat I  Pain Control:  IV Fentanyl  Anticipated MOD:  SVD  Sharyon Cable, CNM 03/25/2018, 11:56 AM

## 2018-03-25 NOTE — MAU Note (Signed)
Pt reports to MAU c/o ctx every 2-54min. No bleeding but she has a mucous discharge. Pt does not think her water is broken. +FM.

## 2018-03-26 ENCOUNTER — Encounter: Payer: Self-pay | Admitting: Certified Nurse Midwife

## 2018-03-26 LAB — GC/CHLAMYDIA PROBE AMP (~~LOC~~) NOT AT ARMC
Chlamydia: NEGATIVE
Neisseria Gonorrhea: NEGATIVE

## 2018-03-26 NOTE — Lactation Note (Signed)
This note was copied from a baby's chart. Lactation Consultation Note New mom states nipples and breast are sore. No bruising noted to everted nipples w/horizontal crease to center of nipple. Mom states breast are tender. Hand expressed colostrum. Baby sleepy wouldn't latch at this time. Baby born at 6.1 lbs, has dropped to below 6 lbs.  Encouraged mom to use DEBP and hand express after using DEBP to give colostrum back to baby after feeding as supplement. Discussed possibility of needing to supplement. Mom is to alert RN if baby isn't feeding well. Discussed importance of I&O, STS, positioning, and newborn feeding habits. LC took DEBP and kit into rm. Mom was getting in shower. Encouraged FOB ask mom to call for RN to set up DEBP. Gave report to oncoming LC.  Patient Name: Susan Anthony [id number] Date: 03/26/2018 Reason for consult: Initial assessment;Early term 37-38.6wks;Infant < 6lbs;1st time breastfeeding   Maternal Data Has patient been taught Hand Expression?: Yes Does the patient have breastfeeding experience prior to this delivery?: No  Feeding Feeding Type: Breast Fed  LATCH Score Latch: Too sleepy or reluctant, no latch achieved, no sucking elicited.  Audible Swallowing: None  Type of Nipple: Everted at rest and after stimulation  Comfort (Breast/Nipple): Filling, red/small blisters or bruises, mild/mod discomfort  Hold (Positioning): Full assist, staff holds infant at breast  LATCH Score: 3  Interventions Interventions: Breast feeding basics reviewed;Adjust position;Assisted with latch;Support pillows;Skin to skin;Position options;Breast massage;Hand express;Breast compression;DEBP(left DEBP in rm. mom in shower)  Lactation Tools Discussed/Used WIC Program: No   Consult Status Consult Status: Follow-up Date: 03/26/18 Follow-up type: In-patient    Theodoro Kalata 03/26/2018, 7:32 AM

## 2018-03-26 NOTE — Progress Notes (Signed)
Post Partum Day 1 Subjective: no complaints, up ad lib, voiding and tolerating PO  Objective: Blood pressure 123/81, pulse 67, temperature 98.4 F (36.9 C), temperature source Oral, resp. rate 18, last menstrual period 06/29/2017, SpO2 100 %, unknown if currently breastfeeding.  Physical Exam:  General: alert, cooperative and no distress Lochia: appropriate Uterine Fundus: firm Incision: n/a DVT Evaluation: No evidence of DVT seen on physical exam.  Recent Labs    03/25/18 0247  HGB 9.7*  HCT 31.4*    Assessment/Plan: Plan for discharge tomorrow, Breastfeeding and Contraception IUD   LOS: 1 day   Sharen Counter 03/26/2018, 8:42 PM

## 2018-03-26 NOTE — Progress Notes (Signed)
POSTPARTUM PROGRESS NOTE  Post Partum Day 1  Subjective:  Susan Anthony is a 23 y.o. G1P1001 [redacted]w[redacted]d s/p SVD.  No acute events overnight.  Pt denies problems with ambulating, voiding or po intake.  She denies nausea or vomiting.  Pain is well controlled.  She has had flatus. She has not had bowel movement.  Lochia Small.   Objective: Blood pressure 123/81, pulse 67, temperature 98.4 F (36.9 C), temperature source Oral, resp. rate 18, last menstrual period 06/29/2017, SpO2 100 %, unknown if currently breastfeeding.  Physical Exam:  General: alert, cooperative and no distress Lochia:normal flow Chest: no respiratory distress Heart:regular rate, distal pulses intact Abdomen: soft, nontender Uterine Fundus: firm, appropriately tender, below the umbilicus DVT Evaluation: No calf swelling or tenderness Extremities: no edema  Recent Labs    03/25/18 0247  HGB 9.7*  HCT 31.4*    Assessment/Plan:  ASSESSMENT: Susan Anthony is a 23 y.o. G1P1001 [redacted]w[redacted]d s/p SVD  Plan for discharge tomorrow   LOS: 1 day   Arlyce Harman, DO [facility], PGY02 03/26/2018, 8:04 AM

## 2018-03-26 NOTE — Lactation Note (Signed)
This note was copied from a baby's chart. Lactation Consultation Note  Patient Name: Susan Anthony [id number] Date: 03/26/2018 Reason for consult: Follow-up assessment;Early term 37-38.6wks;Infant < 6lbs;1st time breastfeeding;Primapara  21 hours old FT female who is still being exclusively BF but mom will start supplementing with formula tonight due to baby's weight dropping under 6 lbs today. She didn't get set up with a DEBP until noon, so she's only pumped once and got about 2 ml which she fed back to the baby.   Baby was already nursing when entering the room, mom had her on typical cradle hold and she was fully clothed and swaddled in a blanket, latch was very shallow. Asked mom to reposition baby and she let LC put her STS. Noticed she had a stool and RN documented in Flowsheets, she came in to check on mom; LC changed baby's diaper. LC took baby STS to mom's breast in cross cradle hold and baby was able to latch right away, with rhythmical and long movements of her jaw but no audible swallows heard at this point. However mom had big drops of colostrum available when hand expressing, LC showed mom how to finger fed baby.  Explained to mom that due to baby's birth weight, we recommend supplementing temporarily with some formula until she stats getting more volume with the pump, reviewed supplementation guidelines and volumes required for baby's age per LPI policy (due to baby's weight). Mom also aware that she should limit feedings at the breast to no more than 30 minutes at a time. Since mom plans to participate in the [facility] program the formula of choice will be Gerber Gentle and the instrument of supplementation will be a slow flow nipple per mother's choice.   Feeding plan:  1. Encouraged mom to keep nursing baby STS every 3 hours or sooner if feeding cues are present 2. Mom will start suppplementing with Rush Barer Gentle tonight starting with 10 ml and going up to required volumes as baby  tolerates. Mom understands that volumes will increase as baby gets older 3. She'll continue pumping every 3 hours and at least once at night and will feed baby any amount of EBM first before offering any formula  Mom reported all questions and concerns were answered, she's aware of LC services and will call PRN.  Maternal Data    Feeding Feeding Type: Breast Fed  LATCH Score Latch: Grasps breast easily, tongue down, lips flanged, rhythmical sucking.  Audible Swallowing: None  Type of Nipple: Everted at rest and after stimulation  Comfort (Breast/Nipple): Soft / non-tender  Hold (Positioning): Assistance needed to correctly position infant at breast and maintain latch.  LATCH Score: 7  Interventions Interventions: Breast feeding basics reviewed;Assisted with latch;Skin to skin;Breast massage;Hand express;Breast compression;Adjust position;Support pillows  Lactation Tools Discussed/Used     Consult Status Consult Status: Follow-up Date: 03/27/18 Follow-up type: In-patient    Nahlia Hellmann Venetia Constable 03/26/2018, 5:57 PM

## 2018-03-27 DIAGNOSIS — Z3A38 38 weeks gestation of pregnancy: Secondary | ICD-10-CM

## 2018-03-27 DIAGNOSIS — O99824 Streptococcus B carrier state complicating childbirth: Secondary | ICD-10-CM

## 2018-03-27 MED ORDER — IBUPROFEN 800 MG PO TABS: 800.0000 mg | ORAL_TABLET | Freq: Three times a day (TID) | ORAL | 0 refills | Status: DC

## 2018-03-27 MED ORDER — SENNOSIDES-DOCUSATE SODIUM 8.6-50 MG PO TABS: 2.0000 | ORAL_TABLET | Freq: Every evening | ORAL | 0 refills | Status: DC | PRN

## 2018-03-27 NOTE — Discharge Instructions (Signed)
Vaginal Delivery, Care After °Refer to this sheet in the next few weeks. These instructions provide you with information about caring for yourself after vaginal delivery. Your health care provider may also give you more specific instructions. Your treatment has been planned according to current medical practices, but problems sometimes occur. Call your health care provider if you have any problems or questions. °What can I expect after the procedure? °After vaginal delivery, it is common to have: °· Some bleeding from your vagina. °· Soreness in your abdomen, your vagina, and the area of skin between your vaginal opening and your anus (perineum). °· Pelvic cramps. °· Fatigue. °Follow these instructions at home: °Medicines °· Take over-the-counter and prescription medicines only as told by your health care provider. °· If you were prescribed an antibiotic medicine, take it as told by your health care provider. Do not stop taking the antibiotic until it is finished. °Driving ° °· Do not drive or operate heavy machinery while taking prescription pain medicine. °· Do not drive for 24 hours if you received a sedative. °Lifestyle °· Do not drink alcohol. This is especially important if you are breastfeeding or taking medicine to relieve pain. °· Do not use tobacco products, including cigarettes, chewing tobacco, or e-cigarettes. If you need help quitting, ask your health care provider. °Eating and drinking °· Drink at least 8 eight-ounce glasses of water every day unless you are told not to by your health care provider. If you choose to breastfeed your baby, you may need to drink more water than this. °· Eat high-fiber foods every day. These foods may help prevent or relieve constipation. High-fiber foods include: °? Whole grain cereals and breads. °? Brown rice. °? Beans. °? Fresh fruits and vegetables. °Activity °· Return to your normal activities as told by your health care provider. Ask your health care provider what  activities are safe for you. °· Rest as much as possible. Try to rest or take a nap when your baby is sleeping. °· Do not lift anything that is heavier than your baby or 10 lb (4.5 kg) until your health care provider says that it is safe. °· Talk with your health care provider about when you can engage in sexual activity. This may depend on your: °? Risk of infection. °? Rate of healing. °? Comfort and desire to engage in sexual activity. °Vaginal Care °· If you have an episiotomy or a vaginal tear, check the area every day for signs of infection. Check for: °? More redness, swelling, or pain. °? More fluid or blood. °? Warmth. °? Pus or a bad smell. °· Do not use tampons or douches until your health care provider says this is safe. °· Watch for any blood clots that may pass from your vagina. These may look like clumps of dark red, brown, or black discharge. °General instructions °· Keep your perineum clean and dry as told by your health care provider. °· Wear loose, comfortable clothing. °· Wipe from front to back when you use the toilet. °· Ask your health care provider if you can shower or take a bath. If you had an episiotomy or a perineal tear during labor and delivery, your health care provider may tell you not to take baths for a certain length of time. °· Wear a bra that supports your breasts and fits you well. °· If possible, have someone help you with household activities and help care for your baby for at least a few days after you   leave the hospital. °· Keep all follow-up visits for you and your baby as told by your health care provider. This is important. °Contact a health care provider if: °· You have: °? Vaginal discharge that has a bad smell. °? Difficulty urinating. °? Pain when urinating. °? A sudden increase or decrease in the frequency of your bowel movements. °? More redness, swelling, or pain around your episiotomy or vaginal tear. °? More fluid or blood coming from your episiotomy or vaginal  tear. °? Pus or a bad smell coming from your episiotomy or vaginal tear. °? A fever. °? A rash. °? Little or no interest in activities you used to enjoy. °? Questions about caring for yourself or your baby. °· Your episiotomy or vaginal tear feels warm to the touch. °· Your episiotomy or vaginal tear is separating or does not appear to be healing. °· Your breasts are painful, hard, or turn red. °· You feel unusually sad or worried. °· You feel nauseous or you vomit. °· You pass large blood clots from your vagina. If you pass a blood clot from your vagina, save it to show to your health care provider. Do not flush blood clots down the toilet without having your health care provider look at them. °· You urinate more than usual. °· You are dizzy or light-headed. °· You have not breastfed at all and you have not had a menstrual period for 12 weeks after delivery. °· You have stopped breastfeeding and you have not had a menstrual period for 12 weeks after you stopped breastfeeding. °Get help right away if: °· You have: °? Pain that does not go away or does not get better with medicine. °? Chest pain. °? Difficulty breathing. °? Blurred vision or spots in your vision. °? Thoughts about hurting yourself or your baby. °· You develop pain in your abdomen or in one of your legs. °· You develop a severe headache. °· You faint. °· You bleed from your vagina so much that you fill two sanitary pads in one hour. °This information is not intended to replace advice given to you by your health care provider. Make sure you discuss any questions you have with your health care provider. °Document Released: 01/12/2000 Document Revised: 06/28/2015 Document Reviewed: 01/29/2015 °Elsevier Interactive Patient Education © 2019 [employer] ° °

## 2018-04-02 ENCOUNTER — Encounter: Payer: Self-pay | Admitting: Certified Nurse Midwife

## 2018-04-06 ENCOUNTER — Telehealth: Payer: Self-pay

## 2018-04-06 ENCOUNTER — Other Ambulatory Visit: Payer: Self-pay | Admitting: Obstetrics

## 2018-04-06 DIAGNOSIS — O9122 Nonpurulent mastitis associated with the puerperium: Secondary | ICD-10-CM

## 2018-04-06 MED ORDER — AMOXICILLIN-POT CLAVULANATE 875-125 MG PO TABS: 1.0000 | ORAL_TABLET | Freq: Two times a day (BID) | ORAL | 1 refills | Status: DC

## 2018-04-06 NOTE — Telephone Encounter (Signed)
Augmentin Rx.  If she is not improved in 2-3 days she should go to hospital.

## 2018-04-06 NOTE — Telephone Encounter (Signed)
Home health nurse called for the patient and stated that she thinks patient may have mastitis, symptoms: hard, tender, and red breast, fever of 101 degrees and chills. Nurse would like to know if patient needs to come in and be seen or if we can treat her. She is postpartum 2 weeks and breast feeding.

## 2018-04-17 ENCOUNTER — Telehealth: Payer: Self-pay

## 2018-04-17 NOTE — Telephone Encounter (Signed)
S/w pt and advised that [facility] nurse called about elevated BP and R breast pain. Patient stated that she has antibiotics to treat mastitis. Pt reports occasional headaches, advised pt that I would route to a provider for review. PP appt 04-23-18.

## 2018-04-17 NOTE — Telephone Encounter (Signed)
Returned call and advised of provider advice to be evaluated at hospital for BP workup. Pt agreed.

## 2018-04-17 NOTE — Telephone Encounter (Signed)
Advise patient to go to hospital for labs and BP check

## 2018-04-23 ENCOUNTER — Encounter: Payer: Self-pay | Admitting: Certified Nurse Midwife

## 2018-04-23 ENCOUNTER — Ambulatory Visit (INDEPENDENT_AMBULATORY_CARE_PROVIDER_SITE_OTHER): Payer: Self-pay | Admitting: Certified Nurse Midwife

## 2018-04-23 ENCOUNTER — Other Ambulatory Visit: Payer: Self-pay

## 2018-04-23 NOTE — Patient Instructions (Signed)

## 2018-04-23 NOTE — Progress Notes (Signed)
Post Partum Exam  Susan Anthony is a 23 y.o. G31P1001 female who presents for a postpartum visit. She is 4 weeks postpartum following a spontaneous vaginal delivery. I have fully reviewed the prenatal and intrapartum course. The delivery was at 38 gestational weeks.  Anesthesia: none. Postpartum course has been unremarkable. Baby's course has been unremarkable. Baby is feeding by breast. Bleeding staining only. Bowel function is normal. Bladder function is normal. Patient is not sexually active. Contraception method is . Postpartum depression screening:neg  The following portions of the patient's history were reviewed and updated as appropriate: allergies, current medications, past medical history, past social history and problem list. Last pap smear done 09/2017 and was Normal  Review of Systems A comprehensive review of systems was negative.    Objective:  unknown if currently breastfeeding.  General:  alert, cooperative and no distress   Breasts:  inspection negative, no nipple discharge or bleeding, no masses or nodularity palpable  Lungs: clear to auscultation bilaterally  Heart:  regular rate and rhythm and S1, S2 normal  Abdomen: soft, non-tender; bowel sounds normal; no masses,  no organomegaly   Vulva:  not evaluated  Vagina: not evaluated  Cervix:  not evaluated  Corpus: not examined  Adnexa:  not evaluated  Rectal Exam: Not performed.        Assessment/Plan:  1. Postpartum care and examination - Normal postpartum exam. Pap smear not done at today's visit.  - Educated and discussed birth control options in detail. Answered patient's questions. Patient decided on IUD for birth control method  - She does not have insurance, patient for program through [employer] given to patient to be filled out, will plan for placement of IUD once approved by program   Follow up in: 2 weeks for Mirena insertion or as needed.   Sharyon Cable, CNM 04/23/18, 3:37 PM

## 2019-02-10 ENCOUNTER — Ambulatory Visit: Payer: Self-pay | Admitting: Women's Health

## 2019-03-17 ENCOUNTER — Ambulatory Visit: Payer: Self-pay | Admitting: Women's Health

## 2019-03-17 DIAGNOSIS — Z0289 Encounter for other administrative examinations: Secondary | ICD-10-CM

## 2019-04-23 ENCOUNTER — Other Ambulatory Visit: Payer: Self-pay

## 2019-04-23 ENCOUNTER — Encounter (HOSPITAL_COMMUNITY): Payer: Self-pay

## 2019-04-23 ENCOUNTER — Emergency Department (HOSPITAL_COMMUNITY)
Admission: EM | Admit: 2019-04-23 | Discharge: 2019-04-23 | Disposition: A | Discharge status: Home / Self Care | Payer: Worker's Compensation | Attending: Emergency Medicine | Admitting: Emergency Medicine

## 2019-04-23 DIAGNOSIS — Y929 Unspecified place or not applicable: Secondary | ICD-10-CM | POA: Insufficient documentation

## 2019-04-23 DIAGNOSIS — W260XXA Contact with knife, initial encounter: Secondary | ICD-10-CM | POA: Insufficient documentation

## 2019-04-23 DIAGNOSIS — Y990 Civilian activity done for income or pay: Secondary | ICD-10-CM | POA: Insufficient documentation

## 2019-04-23 DIAGNOSIS — Y939 Activity, unspecified: Secondary | ICD-10-CM | POA: Insufficient documentation

## 2019-04-23 DIAGNOSIS — S61011A Laceration without foreign body of right thumb without damage to nail, initial encounter: Secondary | ICD-10-CM | POA: Diagnosis present

## 2019-04-23 MED ORDER — LIDOCAINE HCL (PF) 1 % IJ SOLN
30.0000 mL | Freq: Once | INTRAMUSCULAR | Status: DC
  Filled 2019-04-23: qty 30

## 2019-04-23 MED ORDER — ACETAMINOPHEN 325 MG PO TABS
650.0000 mg | ORAL_TABLET | Freq: Once | ORAL | Status: AC
  Administered 2019-04-23: 650 mg via ORAL
  Filled 2019-04-23: qty 2

## 2019-04-23 NOTE — Discharge Instructions (Signed)
1. Medications: Tylenol or ibuprofen for pain, usual home medications  2. Treatment: ice for swelling, keep wound clean with warm soap and water and keep bandage dry, do not submerge in water for 24 hours  3. Follow Up: Please return in 7 days to have your stitches removed or sooner if you have concerns. Return to the emergency department for increased redness, drainage of pus from the wound   WOUND CARE  Keep area clean and dry for 24 hours. Do not remove bandage, if applied.  After 24 hours, remove bandage and wash wound gently with mild soap and warm water. Reapply a new bandage after cleaning wound, if directed.   Continue daily cleansing with soap and water until stitches/staples are removed.  Do not apply any ointments or creams to the wound while stitches/staples are in place, as this may cause delayed healing. Return if you experience any of the following signs of infection: Swelling, redness, pus drainage, streaking, fever >101.0 F  Return if you experience excessive bleeding that does not stop after 15-20 minutes of constant, firm pressure.  

## 2019-04-23 NOTE — ED Provider Notes (Signed)
[place] EMERGENCY DEPARTMENT Provider Note   CSN: [id number] Arrival date & time: 04/23/19  1059     History Chief Complaint  Patient presents with  . Extremity Laceration    Susan Anthony is a 24 y.o. female presents emergency department today with chief complaint of right thumb laceration happening 30 minutes prior to arrival.  Patient was at work using a box cutter when she slipped and the blade hit her thumb.  She had immediate bleeding and pain.  She describes the pain as constant and throbbing.  Pain is localized to the laceration.  She rates the pain 7 of 10 in severity.  No medications for symptoms prior to arrival. She washed laceration with soap and water prior to arrival. She has decreased sensation to tip of thumb. Tetanus is up to date. She denies fever, chills, weakness.  History provided by patient with additional history obtained from chart review.     Past Medical History:  Diagnosis Date  . Medical history non-contributory     Patient Active Problem List   Diagnosis Date Noted  . Normal labor and delivery 03/25/2018  . SVD (spontaneous vaginal delivery) 03/25/2018  . Chlamydia infection affecting pregnancy 12/11/2017  . Supervision of normal first pregnancy, antepartum 10/13/2017    Past Surgical History:  Procedure Laterality Date  . NO PAST SURGERIES       OB History    Gravida  1   Para  1   Term  1   Preterm  0   AB  0   Living  1     SAB  0   TAB  0   Ectopic  0   Multiple  0   Live Births  1           Family History  Problem Relation Age of Onset  . Diabetes Maternal Grandmother   . Hypertension Maternal Grandmother   . Hyperlipidemia Maternal Grandmother   . Diabetes Paternal Grandmother   . Hypertension Paternal Grandmother   . Hyperlipidemia Paternal Grandmother     Social History   Tobacco Use  . Smoking status: Never Smoker  . Smokeless tobacco: Never Used  Substance Use  Topics  . Alcohol use: Not Currently  . Drug use: Never    Home Medications Prior to Admission medications   Medication Sig Start Date End Date Taking? Authorizing Provider  ibuprofen (ADVIL,MOTRIN) 800 MG tablet Take 1 tablet (800 mg total) by mouth 3 (three) times daily. 03/27/18   Glenice Bow, DO  prenatal vitamin w/FE, FA (PRENATAL 1 + 1) 27-1 MG TABS tablet Take 1 tablet by mouth daily at 12 noon.    [provider]  senna-docusate (SENOKOT-S) 8.6-50 MG tablet Take 2 tablets by mouth at bedtime as needed for mild constipation. 03/27/18   Glenice Bow, DO    Allergies    Patient has no known allergies.  Review of Systems   Review of Systems All other systems are reviewed and are negative for acute change except as noted in the HPI.  Physical Exam Updated Vital Signs BP 117/74   Pulse 71   Temp 98.7 F (37.1 C) (Oral)   Resp 16   Ht 5\' 1"  (1.549 m)   Wt 78 kg   SpO2 98%   BMI 32.50 kg/m   Physical Exam Vitals and nursing note reviewed.  Constitutional:      Appearance: She is well-developed. She is not ill-appearing or toxic-appearing.  HENT:     Head: Normocephalic and atraumatic.     Nose: Nose normal.  Eyes:     General: No scleral icterus.       Right eye: No discharge.        Left eye: No discharge.     Conjunctiva/sclera: Conjunctivae normal.  Neck:     Vascular: No JVD.  Cardiovascular:     Rate and Rhythm: Normal rate and regular rhythm.     Pulses: Normal pulses.          Radial pulses are 2+ on the right side.     Heart sounds: Normal heart sounds.  Pulmonary:     Effort: Pulmonary effort is normal.     Breath sounds: Normal breath sounds.  Abdominal:     General: There is no distension.  Musculoskeletal:        General: Normal range of motion.     Cervical back: Normal range of motion.  Skin:    General: Skin is warm and dry.     Capillary Refill: Capillary refill takes less than 2 seconds.     Comments: 2 cm laceration to  right thumb distal phalanx. Laceration is next to the nail. Nail is very much intact. Bleeding is controlled. Strength intact against resistance. Sensation intact. Full ROM of right wrist  Neurological:     Mental Status: She is oriented to person, place, and time.     GCS: GCS eye subscore is 4. GCS verbal subscore is 5. GCS motor subscore is 6.     Comments: Fluent speech, no facial droop.  Psychiatric:        Behavior: Behavior normal.     ED Results / Procedures / Treatments   Labs (all labs ordered are listed, but only abnormal results are displayed) Labs Reviewed - No data to display  EKG None  Radiology No results found.  Procedures .Marland KitchenLaceration Repair  Date/Time: 04/23/2019 2:30 PM Performed by: Sherene Sires, PA-C Authorized by: Sherene Sires, PA-C   Consent:    Consent obtained:  Verbal   Consent given by:  Patient   Risks discussed:  Infection   Alternatives discussed:  No treatment Anesthesia (see MAR for exact dosages):    Anesthesia method:  Local infiltration   Local anesthetic:  Lidocaine 1% w/o epi Laceration details:    Location:  Finger   Finger location:  R thumb   Length (cm):  2   Depth (mm):  1 Repair type:    Repair type:  Simple Pre-procedure details:    Preparation:  Patient was prepped and draped in usual sterile fashion Exploration:    Hemostasis achieved with:  Direct pressure   Wound exploration: wound explored through full range of motion and entire depth of wound probed and visualized   Treatment:    Area cleansed with:  Betadine   Amount of cleaning:  Standard   Irrigation solution:  Sterile water   Irrigation volume:  500 ml   Irrigation method:  Syringe Skin repair:    Repair method:  Sutures   Suture size:  5-0   Suture technique:  Simple interrupted   Number of sutures:  2 Approximation:    Approximation:  Close Post-procedure details:    Dressing:  Bulky dressing   Patient tolerance of procedure:   Tolerated well, no immediate complications   (including critical care time)  Medications Ordered in ED Medications  lidocaine (PF) (XYLOCAINE) 1 % injection 30 mL (has no administration  in time range)  acetaminophen (TYLENOL) tablet 650 mg (650 mg Oral Given 04/23/19 1222)    ED Course  I have reviewed the triage vital signs and the nursing notes.  Pertinent labs & imaging results that were available during my care of the patient were reviewed by me and considered in my medical decision making (see chart for details).    MDM Rules/Calculators/A&P                      Patient presents to the emergency department with laceration to right thumb which occurred within 1 hours PTA. Patient nontoxic appearing, resting comfortably.  Pressure irrigation performed. Wound explored and base of wound visualized in a bloodless field without evidence of foreign body. Laceration repair per procedure note above, tolerated well. Tetanus is up to date Do not feel that abx are indicated at this time based on wound appearance and lack of significant comorbidities. Discussed suture home care as well as need for wound recheck and suture removal in 7 days.  I discussed results, treatment plan, need for follow-up, and return precautions with the patient including signs of infection. Provided opportunity for questions, patient confirmed understanding and is in agreement with plan.     Portions of this note were generated with Scientist, clinical (histocompatibility and immunogenetics). Dictation errors may occur despite best attempts at proofreading.   Final Clinical Impression(s) / ED Diagnoses Final diagnoses:  Laceration of right thumb without foreign body without damage to nail, initial encounter    Rx / DC Orders ED Discharge Orders    None       Sherene Sires, PA-C 04/23/19 1435    Tegeler, Canary Brim, MD 04/23/19 [phone]

## 2019-04-23 NOTE — ED Triage Notes (Signed)
Pt cut her right thumb with a box cutter while at work, slight bleeding noted. Unknown last tetanus, CMS intact.

## 2019-04-23 NOTE — ED Notes (Signed)
Patient verbalizes understanding of discharge instructions. Opportunity for questioning and answers were provided. Pt discharged from ED. 

## 2019-12-08 ENCOUNTER — Encounter: Payer: Managed Care, Other (non HMO) | Admitting: Obstetrics and Gynecology

## 2019-12-16 IMAGING — US US OB COMP LESS 14 WK
2 series · 14 of 28 positions shown · non-contrast
Comparison: None.

CLINICAL DATA: Pregnant patient with vaginal bleeding.  Cramping.

EXAM:
OBSTETRIC <14 WK US AND TRANSVAGINAL OB US
TECHNIQUE: Both transabdominal and transvaginal ultrasound examinations were
performed for complete evaluation of the gestation as well as the
maternal uterus, adnexal regions, and pelvic cul-de-sac.
Transvaginal technique was performed to assess early pregnancy.

[Series 1: us ob comp less 14 wk · 0.20mm/px · 6 of 24 slices shown (1 of 2)]
[im 3/24]
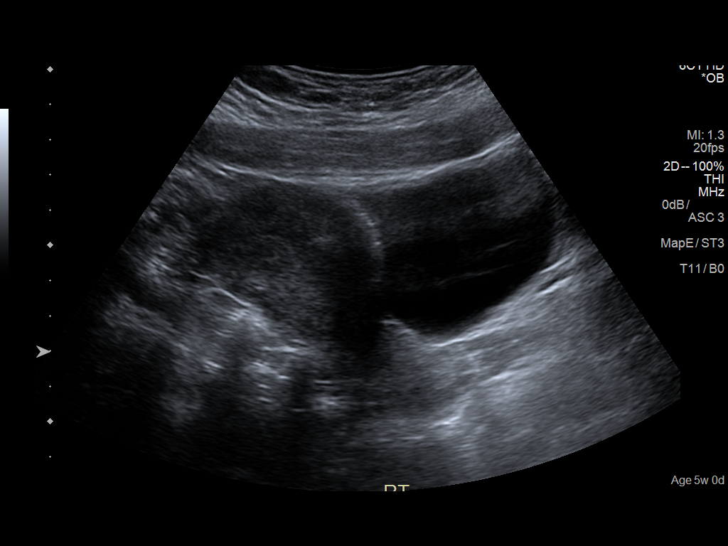
[im 7/24]
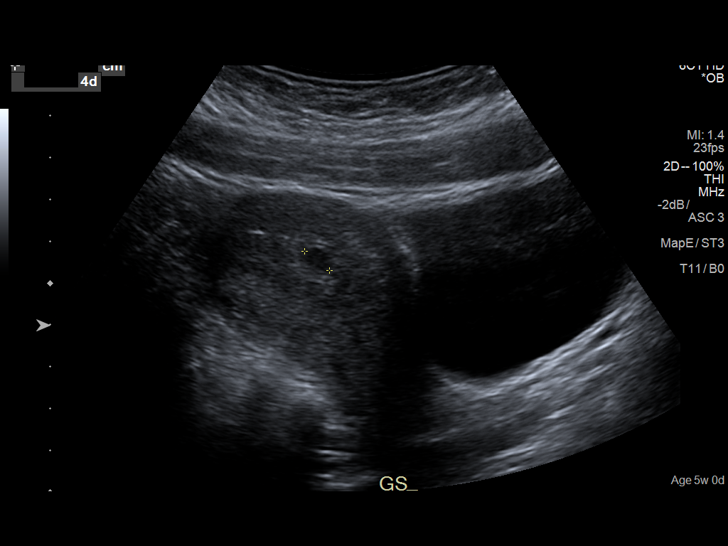
[im 11/24]
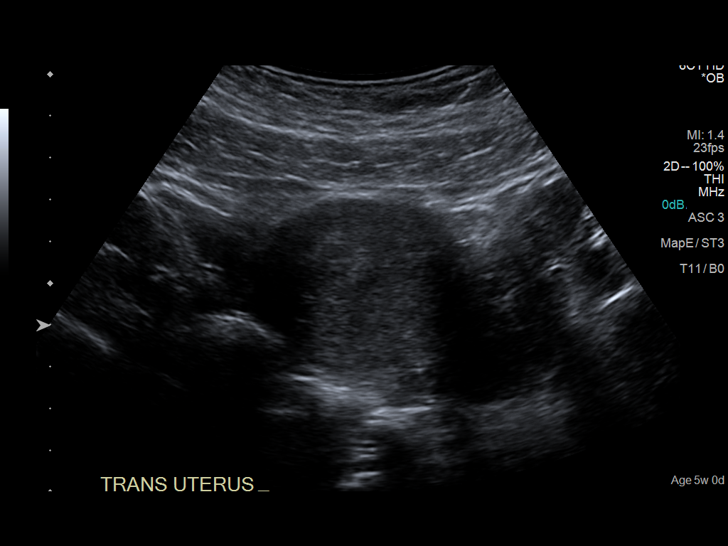
[im 15/24]
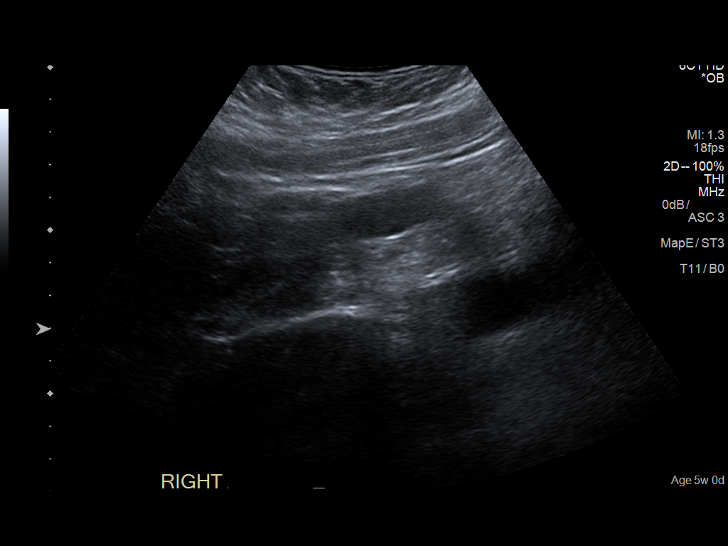
[im 19/24]
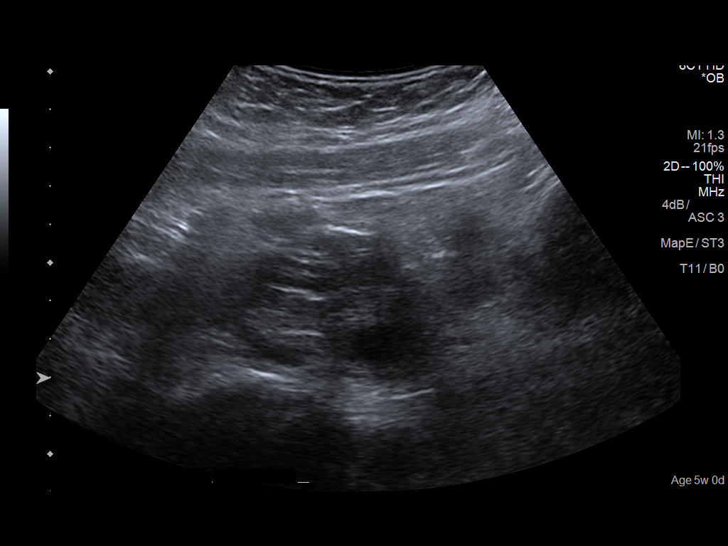
[im 24/24]
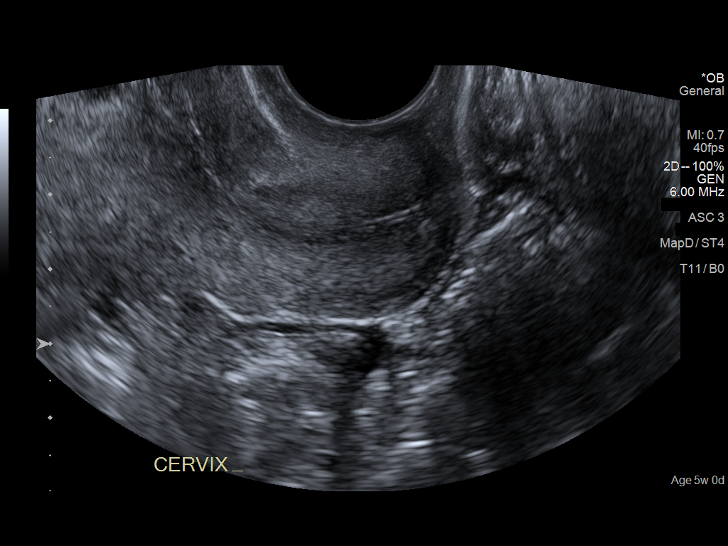

[Series 2: us ob comp less 14 wk · 0.11mm/px · 8 of 34 slices shown (2 of 2)]
[im 3/34]
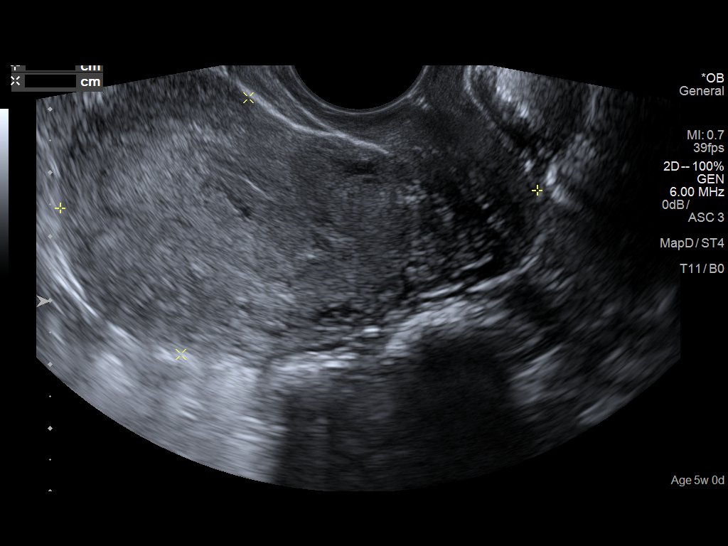
[im 7/34]
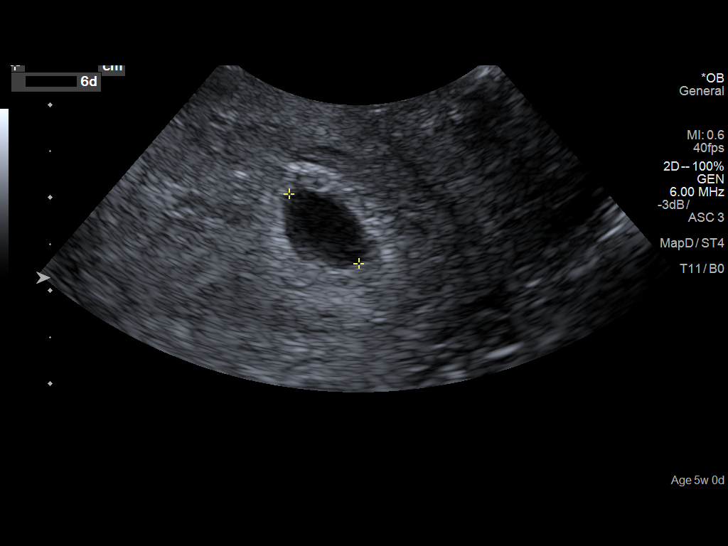
[im 12/34]
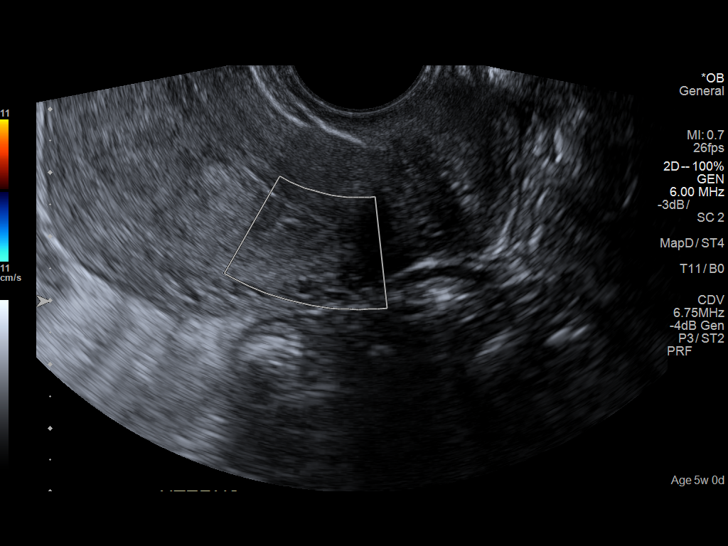
[im 16/34]
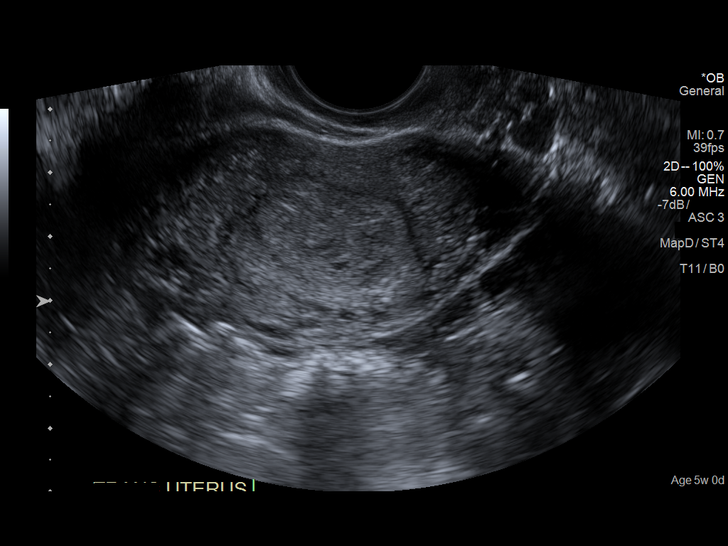
[im 20/34]
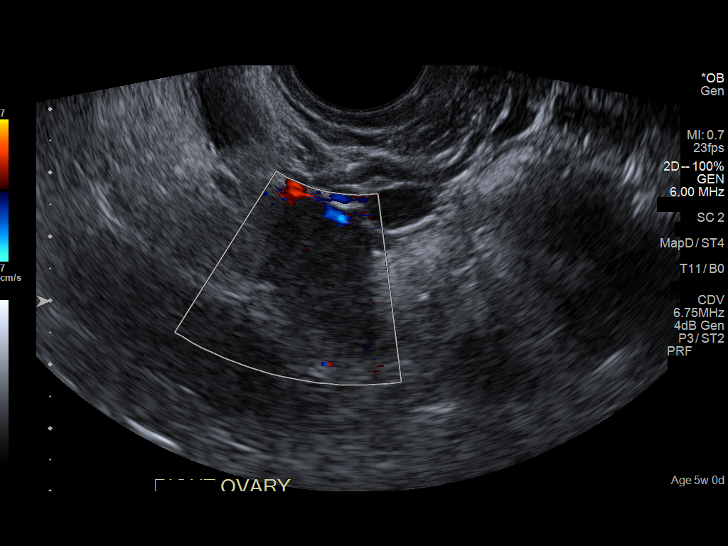
[im 25/34]
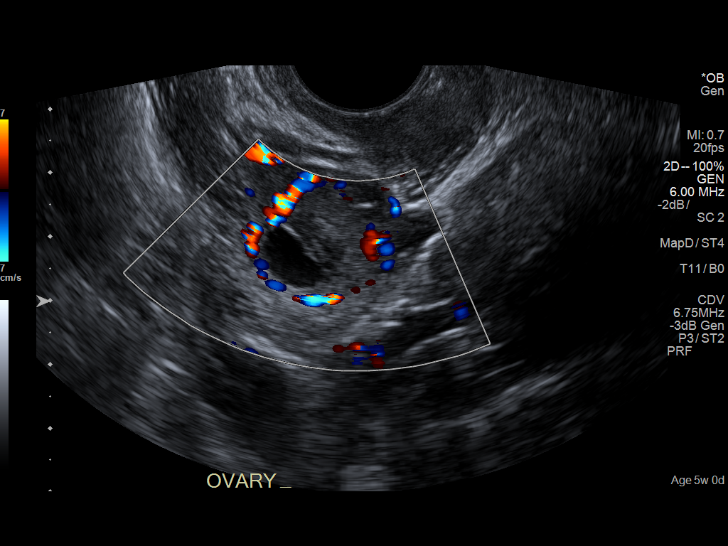
[im 29/34]
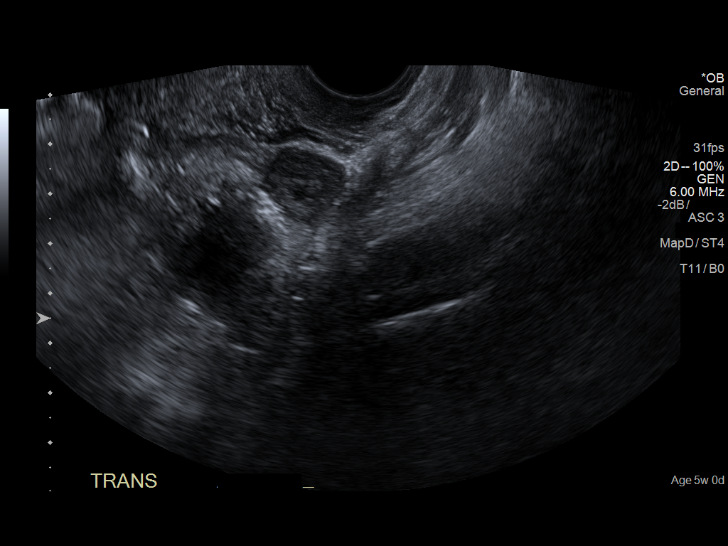
[im 34/34]
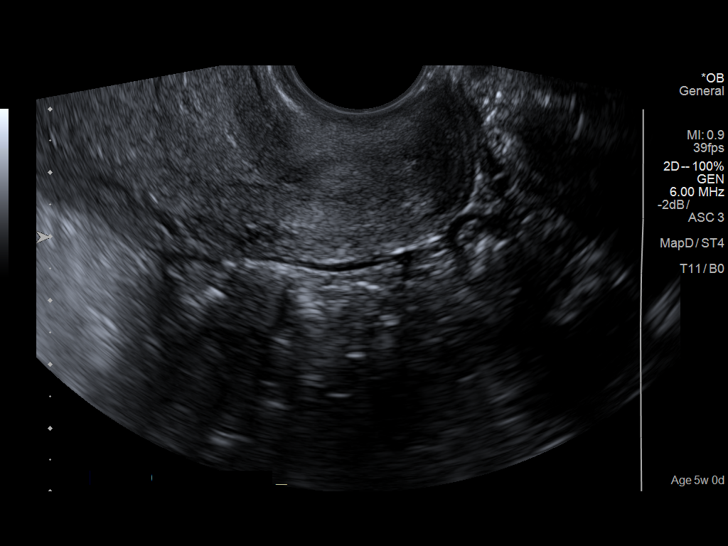

[14 of 28 positions shown; findings below may reference images not displayed]

FINDINGS: Intrauterine gestational sac: Single

Yolk sac:  Visualized.

Embryo:  Not Visualized.

MSD: 8.9 mm   5 w   5 d

CRL:    mm    w    d                  US EDC:

Subchorionic hemorrhage:  Small measuring 6.2 x 5.6 x 5.4 mm.

Maternal uterus/adnexae: The ovaries are normal in appearance.
Corpus luteum cyst in the left ovary.
IMPRESSION: 1. Single IUP. The gestational sac and yolk sac are seen. No fetal
pole at this time.
2. Small subchorionic hemorrhage.

## 2020-09-16 ENCOUNTER — Encounter (HOSPITAL_COMMUNITY): Payer: Self-pay | Admitting: *Deleted

## 2020-09-16 ENCOUNTER — Ambulatory Visit (HOSPITAL_COMMUNITY)
Admission: EM | Admit: 2020-09-16 | Discharge: 2020-09-16 | Disposition: A | Discharge status: Home / Self Care | Payer: Managed Care, Other (non HMO) | Attending: Medical Oncology | Admitting: Medical Oncology

## 2020-09-16 ENCOUNTER — Other Ambulatory Visit: Payer: Self-pay

## 2020-09-16 DIAGNOSIS — Z20822 Contact with and (suspected) exposure to covid-19: Secondary | ICD-10-CM | POA: Insufficient documentation

## 2020-09-16 DIAGNOSIS — J029 Acute pharyngitis, unspecified: Secondary | ICD-10-CM

## 2020-09-16 LAB — SARS CORONAVIRUS 2 (TAT 6-24 HRS): SARS Coronavirus 2: NEGATIVE

## 2020-09-16 LAB — POCT RAPID STREP A, ED / UC: Streptococcus, Group A Screen (Direct): NEGATIVE

## 2020-09-16 MED ORDER — IBUPROFEN 800 MG PO TABS: 800.0000 mg | ORAL_TABLET | Freq: Three times a day (TID) | ORAL | 0 refills | Status: DC

## 2020-09-16 NOTE — ED Provider Notes (Signed)
[facility]    CSN: [id number] Arrival date & time: 09/16/20  1009      History   Chief Complaint Chief Complaint  Patient presents with   Chills   Generalized Body Aches   Headache   Abdominal Pain   Emesis    HPI Susan Anthony is a 25 y.o. female.   HPI  Cold Symptoms: Pt reports that she has had chills, body aches, headache, 1 episodes of GI upset, generalized abdominal pain, few episodes of vomiting since Wednesday. Tmax 102F. She has been using Tylenol for symptoms with some temporarily relief. 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 [facility]: Current.     Past Medical History:  Diagnosis Date   Medical history non-contributory     Patient Active Problem List   Diagnosis Date Noted   Normal labor and delivery 03/25/2018   SVD (spontaneous vaginal delivery) 03/25/2018   Chlamydia infection affecting pregnancy 12/11/2017   Supervision of normal first pregnancy, antepartum 10/13/2017    Past Surgical History:  Procedure Laterality Date   NO PAST SURGERIES      OB History     Gravida  1   Para  1   Term  1   Preterm  0   AB  0   Living  1      SAB  0   IAB  0   Ectopic  0   Multiple  0   Live Births  1            Home Medications    Prior to Admission medications   Medication Sig Start Date End Date Taking? Authorizing Provider  ibuprofen (ADVIL,MOTRIN) 800 MG tablet Take 1 tablet (800 mg total) by mouth 3 (three) times daily. 03/27/18   Tamera Stands, DO  prenatal vitamin w/FE, FA (PRENATAL 1 + 1) 27-1 MG TABS tablet Take 1 tablet by mouth daily at 12 noon.    [provider]  senna-docusate (SENOKOT-S) 8.6-50 MG tablet Take 2 tablets by mouth at bedtime as needed for mild constipation. 03/27/18   Tamera Stands, DO    Family History Family History  Problem Relation Age of Onset   Diabetes Maternal Grandmother    Hypertension Maternal Grandmother     Hyperlipidemia Maternal Grandmother    Diabetes Paternal Grandmother    Hypertension Paternal Grandmother    Hyperlipidemia Paternal Grandmother     Social History Social History   Tobacco Use   Smoking status: Never   Smokeless tobacco: Never  Substance Use Topics   Alcohol use: Not Currently   Drug use: Never     Allergies   Patient has no known allergies.   Review of Systems Review of Systems  As stated above in HPI Physical Exam Triage Vital Signs ED Triage Vitals  Enc Vitals Group     BP 09/16/20 1043 119/82     Pulse Rate 09/16/20 1043 91     Resp 09/16/20 1043 18     Temp 09/16/20 1043 99.3 F (37.4 C)     Temp src --      SpO2 09/16/20 1043 98 %     Weight --      Height --      Head Circumference --      Peak Flow --      Pain Score 09/16/20 1046 8     Pain Loc --      Pain  Edu? --      Excl. in GC? --    No data found.  Updated Vital Signs BP 119/82   Pulse 91   Temp 99.3 F (37.4 C)   Resp 18   SpO2 98%   Physical Exam Vitals and nursing note reviewed.  Constitutional:      General: She is not in acute distress.    Appearance: She is well-developed. She is not ill-appearing, toxic-appearing or diaphoretic.  HENT:     Head: Normocephalic and atraumatic.     Right Ear: Tympanic membrane normal.     Left Ear: Tympanic membrane normal.     Nose: Congestion and rhinorrhea present.     Mouth/Throat:     Mouth: Mucous membranes are moist.  Eyes:     Extraocular Movements: Extraocular movements intact.     Pupils: Pupils are equal, round, and reactive to light.  Cardiovascular:     Rate and Rhythm: Normal rate and regular rhythm.     Heart sounds: Normal heart sounds.  Pulmonary:     Effort: Pulmonary effort is normal.     Breath sounds: Normal breath sounds.  Abdominal:     Palpations: Abdomen is soft.  Musculoskeletal:        General: Normal range of motion.     Cervical back: Normal range of motion and neck supple. No rigidity.   Lymphadenopathy:     Cervical: No cervical adenopathy.  Skin:    General: Skin is warm.  Neurological:     Mental Status: She is alert and oriented to person, place, and time.     Motor: No weakness.  Psychiatric:        Mood and Affect: Mood normal.        Speech: Speech normal.        Behavior: Behavior normal.     UC Treatments / Results  Labs (all labs ordered are listed, but only abnormal results are displayed) Labs Reviewed - No data to display  EKG   Radiology No results found.  Procedures Procedures (including critical care time)  Medications Ordered in UC Medications - No data to display  Initial Impression / Assessment and Plan / UC Course  I have reviewed the triage vital signs and the nursing notes.  Pertinent labs & imaging results that were available during my care of the patient were reviewed by me and considered in my medical decision making (see chart for details).     New. Likely viral in nature but checking a rapid strep to ensure that this is negative. For now rest, hydration with water, symptomatic treatment. Discussed red flag signs and symptoms.  Final Clinical Impressions(s) / UC Diagnoses   Final diagnoses:  None   Discharge Instructions   None    ED Prescriptions   None    PDMP not reviewed this encounter.   Rushie Chestnut, [place] 09/16/20 1216

## 2020-09-16 NOTE — ED Triage Notes (Signed)
Pt reports multiple Sx's that started Wed. Pt reports a fever of 102 and has been taking Tylenol.

## 2020-09-19 LAB — CULTURE, GROUP A STREP (THRC)

## 2021-11-29 DIAGNOSIS — Z3480 Encounter for supervision of other normal pregnancy, unspecified trimester: Secondary | ICD-10-CM | POA: Insufficient documentation

## 2021-11-30 ENCOUNTER — Ambulatory Visit (INDEPENDENT_AMBULATORY_CARE_PROVIDER_SITE_OTHER): Payer: Self-pay

## 2021-11-30 ENCOUNTER — Encounter: Payer: Self-pay | Admitting: Obstetrics

## 2021-11-30 ENCOUNTER — Ambulatory Visit (INDEPENDENT_AMBULATORY_CARE_PROVIDER_SITE_OTHER): Payer: Self-pay | Admitting: Obstetrics

## 2021-11-30 ENCOUNTER — Other Ambulatory Visit (HOSPITAL_COMMUNITY)
Admission: RE | Admit: 2021-11-30 | Discharge: 2021-11-30 | Disposition: A | Discharge status: Home / Self Care | Payer: Self-pay | Source: Ambulatory Visit | Attending: Obstetrics | Admitting: Obstetrics

## 2021-11-30 VITALS — BP 125/82 | HR 69 | Wt 207.0 lb

## 2021-11-30 DIAGNOSIS — O368330 Maternal care for abnormalities of the fetal heart rate or rhythm, third trimester, not applicable or unspecified: Secondary | ICD-10-CM

## 2021-11-30 DIAGNOSIS — O99012 Anemia complicating pregnancy, second trimester: Secondary | ICD-10-CM

## 2021-11-30 DIAGNOSIS — O99019 Anemia complicating pregnancy, unspecified trimester: Secondary | ICD-10-CM

## 2021-11-30 DIAGNOSIS — Z3480 Encounter for supervision of other normal pregnancy, unspecified trimester: Secondary | ICD-10-CM

## 2021-11-30 DIAGNOSIS — O219 Vomiting of pregnancy, unspecified: Secondary | ICD-10-CM

## 2021-11-30 DIAGNOSIS — Z3482 Encounter for supervision of other normal pregnancy, second trimester: Secondary | ICD-10-CM

## 2021-11-30 DIAGNOSIS — O368390 Maternal care for abnormalities of the fetal heart rate or rhythm, unspecified trimester, not applicable or unspecified: Secondary | ICD-10-CM

## 2021-11-30 DIAGNOSIS — Z3A17 17 weeks gestation of pregnancy: Secondary | ICD-10-CM

## 2021-11-30 MED ORDER — DOXYLAMINE-PYRIDOXINE 10-10 MG PO TBEC: DELAYED_RELEASE_TABLET | ORAL | 5 refills | Status: DC

## 2021-11-30 NOTE — Progress Notes (Signed)
NOB in office, pregnancy confirmed at [facility]. Pt states that she does not think dating is correct and that she is around 13mths pregnant not 17 weeks. Pt states that she had a pap smear at [facility] in May of this year with normal results.

## 2021-11-30 NOTE — Progress Notes (Signed)
Subjective:    Susan Anthony is being seen today for her first obstetrical visit.  This is not a planned pregnancy. She is at [redacted]w[redacted]d gestation. Her obstetrical history is significant for  chlamydia . Relationship with FOB: significant other, not living together. Patient does intend to breast feed. Pregnancy history fully reviewed.  The information documented in the HPI was reviewed and verified.  Menstrual History: OB History     Gravida  2   Para  1   Term  1   Preterm  0   AB  0   Living  1      SAB  0   IAB  0   Ectopic  0   Multiple  0   Live Births  1            Patient's last menstrual period was 07/28/2021.    Past Medical History:  Diagnosis Date   Medical history non-contributory     Past Surgical History:  Procedure Laterality Date   NO PAST SURGERIES      (Not in a hospital admission)  No Known Allergies  Social History   Tobacco Use   Smoking status: Never   Smokeless tobacco: Never  Substance Use Topics   Alcohol use: Not Currently    Family History  Problem Relation Age of Onset   Diabetes Maternal Grandmother    Hypertension Maternal Grandmother    Hyperlipidemia Maternal Grandmother    Diabetes Paternal Grandmother    Hypertension Paternal Grandmother    Hyperlipidemia Paternal Grandmother      Review of Systems Constitutional: negative for weight loss Gastrointestinal: negative for vomiting Genitourinary:negative for genital lesions and vaginal discharge and dysuria Musculoskeletal:negative for back pain Behavioral/Psych: negative for abusive relationship, depression, illegal drug usage and tobacco use    Objective:    BP 125/82   Pulse 69   Wt 207 lb (93.9 kg)   LMP 07/28/2021   BMI 39.11 kg/m  General Appearance:    Alert, cooperative, no distress, appears stated age  Head:    Normocephalic, without obvious abnormality, atraumatic  Eyes:    PERRL, conjunctiva/corneas clear, EOM's intact, fundi     benign, both eyes  Ears:    Normal TM's and external ear canals, both ears  Nose:   Nares normal, septum midline, mucosa normal, no drainage    or sinus tenderness  Throat:   Lips, mucosa, and tongue normal; teeth and gums normal  Neck:   Supple, symmetrical, trachea midline, no adenopathy;    thyroid:  no enlargement/tenderness/nodules; no carotid   bruit or JVD  Back:     Symmetric, no curvature, ROM normal, no CVA tenderness  Lungs:     Clear to auscultation bilaterally, respirations unlabored  Chest Wall:    No tenderness or deformity   Heart:    Regular rate and rhythm, S1 and S2 normal, no murmur, rub   or gallop  Breast Exam:    No tenderness, masses, or nipple abnormality  Abdomen:     Soft, non-tender, bowel sounds active all four quadrants,    no masses, no organomegaly  Genitalia:    Normal female without lesion, discharge or tenderness  Extremities:   Extremities normal, atraumatic, no cyanosis or edema  Pulses:   2+ and symmetric all extremities  Skin:   Skin color, texture, turgor normal, no rashes or lesions  Lymph nodes:   Cervical, supraclavicular, and axillary nodes normal  Neurologic:   CNII-XII intact, normal  strength, sensation and reflexes    throughout      Lab Review Urine pregnancy test Labs reviewed yes Radiologic studies reviewed yes  Assessment:    1. Supervision of other normal pregnancy, antepartum Rx: - Cytology - PAP( [place]) - Cervicovaginal ancillary only( [place]) - CBC/D/Plt+RPR+Rh+ABO+RubIgG... - Culture, OB Urine - HgB A1c - US OB Comp Less 14 Wks; Future  2. Nausea and vomiting in pregnancy Rx: - Doxylamine-Pyridoxine (DICLEGIS) 10-10 MG TBEC; 1 tab in AM, 1 tab mid afternoon 2 tabs at bedtime. Max dose 4 tabs daily.  Dispense: 100 tablet; Refill: 5  3. Unable to hear fetal heart tones as reason for ultrasound scan X: - US OB Limited; Future  4. Anemia affecting pregnancy, antepartum     Plan:    Follow up in 4  weeks  Prenatal vitamins.  Counseling provided regarding continued use of seat belts, cessation of alcohol consumption, smoking or use of illicit drugs; infection precautions i.e., influenza/TDAP immunizations, toxoplasmosis,CMV, parvovirus, listeria and varicella; workplace safety, exercise during pregnancy; routine dental care, safe medications, sexual activity, hot tubs, saunas, pools, travel, caffeine use, fish and methlymercury, potential toxins, hair treatments, varicose veins Weight gain recommendations per IOM guidelines reviewed: underweight/BMI< 18.5--> gain 28 - 40 lbs; normal weight/BMI 18.5 - 24.9--> gain 25 - 35 lbs; overweight/BMI 25 - 29.9--> gain 15 - 25 lbs; obese/BMI >30->gain  11 - 20 lbs Problem list reviewed and updated. FIRST/CF mutation testing/NIPT/QUAD SCREEN/fragile X/Ashkenazi Jewish population testing/Spinal muscular atrophy discussed: requested. Role of ultrasound in pregnancy discussed; fetal survey: requested. Amniocentesis discussed: not indicated.  Meds ordered this encounter  Medications   Doxylamine-Pyridoxine (DICLEGIS) 10-10 MG TBEC    Sig: 1 tab in AM, 1 tab mid afternoon 2 tabs at bedtime. Max dose 4 tabs daily.    Dispense:  100 tablet    Refill:  5   Orders Placed This Encounter  Procedures   Culture, OB Urine   US OB Limited    Standing Status:   Future    Number of Occurrences:   1    Standing Expiration Date:   12/01/2022    Order Specific Question:   Reason for Exam (SYMPTOM  OR DIAGNOSIS REQUIRED)    Answer:   Dating and viability    Order Specific Question:   Preferred Imaging Location?    Answer:   External   US OB Comp Less 14 Wks    Standing Status:   Future    Standing Expiration Date:   12/01/2022    Order Specific Question:   Reason for Exam (SYMPTOM  OR DIAGNOSIS REQUIRED)    Answer:   Dating    Order Specific Question:   Preferred Imaging Location?    Answer:   [facility] Ultrasound   CBC/D/Plt+RPR+Rh+ABO+RubIgG...   HgB A1c     Follow up in 4 weeks.  I have spent a total of 30 minutes of face-to-face time, excluding clinical staff time, reviewing notes and preparing to see patient, ordering tests and/or medications, and counseling the patient.    Shelly Bombard, MD 11/30/2021 9:59 AM

## 2021-12-01 LAB — CBC/D/PLT+RPR+RH+ABO+RUBIGG...
Antibody Screen: NEGATIVE
Basophils Absolute: 0 10*3/uL (ref 0.0–0.2)
Basos: 1 %
EOS (ABSOLUTE): 0 10*3/uL (ref 0.0–0.4)
Eos: 1 %
HCV Ab: NONREACTIVE
HIV Screen 4th Generation wRfx: NONREACTIVE
Hematocrit: 37.6 % (ref 34.0–46.6)
Hemoglobin: 12.6 g/dL (ref 11.1–15.9)
Hepatitis B Surface Ag: NEGATIVE
Immature Grans (Abs): 0 10*3/uL (ref 0.0–0.1)
Immature Granulocytes: 0 %
Lymphocytes Absolute: 1.6 10*3/uL (ref 0.7–3.1)
Lymphs: 25 %
MCH: 29.9 pg (ref 26.6–33.0)
MCHC: 33.5 g/dL (ref 31.5–35.7)
MCV: 89 fL (ref 79–97)
Monocytes Absolute: 0.4 10*3/uL (ref 0.1–0.9)
Monocytes: 6 %
Neutrophils Absolute: 4.3 10*3/uL (ref 1.4–7.0)
Neutrophils: 67 %
Platelets: 183 10*3/uL (ref 150–450)
RBC: 4.21 x10E6/uL (ref 3.77–5.28)
RDW: 12.8 % (ref 11.7–15.4)
RPR Ser Ql: NONREACTIVE
Rh Factor: POSITIVE
Rubella Antibodies, IGG: 2.75 index (ref >0.99)
WBC: 6.3 10*3/uL (ref 3.4–10.8)

## 2021-12-01 LAB — HEMOGLOBIN A1C
Est. average glucose Bld gHb Est-mCnc: 108 mg/dL
Hgb A1c MFr Bld: 5.4 % (ref 4.8–5.6)

## 2021-12-01 LAB — HCV INTERPRETATION

## 2021-12-02 LAB — CULTURE, OB URINE

## 2021-12-02 LAB — URINE CULTURE, OB REFLEX

## 2021-12-03 ENCOUNTER — Other Ambulatory Visit: Payer: Self-pay | Admitting: Obstetrics

## 2021-12-03 LAB — CERVICOVAGINAL ANCILLARY ONLY
Bacterial Vaginitis (gardnerella): NEGATIVE
Candida Glabrata: NEGATIVE
Candida Vaginitis: POSITIVE — AB
Chlamydia: NEGATIVE
Comment: NEGATIVE
Comment: NEGATIVE
Comment: NEGATIVE
Comment: NEGATIVE
Comment: NEGATIVE
Comment: NORMAL
Neisseria Gonorrhea: NEGATIVE
Trichomonas: NEGATIVE

## 2021-12-11 ENCOUNTER — Other Ambulatory Visit: Payer: Self-pay

## 2021-12-24 ENCOUNTER — Ambulatory Visit
Admission: RE | Admit: 2021-12-24 | Discharge: 2021-12-24 | Disposition: A | Discharge status: Home / Self Care | Payer: Self-pay | Source: Ambulatory Visit | Attending: Obstetrics | Admitting: Obstetrics

## 2021-12-24 DIAGNOSIS — Z3480 Encounter for supervision of other normal pregnancy, unspecified trimester: Secondary | ICD-10-CM | POA: Insufficient documentation

## 2021-12-24 DIAGNOSIS — Z3481 Encounter for supervision of other normal pregnancy, first trimester: Secondary | ICD-10-CM | POA: Insufficient documentation

## 2021-12-24 DIAGNOSIS — Z3A13 13 weeks gestation of pregnancy: Secondary | ICD-10-CM | POA: Insufficient documentation

## 2021-12-25 ENCOUNTER — Telehealth: Payer: Self-pay | Admitting: Medical

## 2021-12-25 NOTE — Telephone Encounter (Signed)
I called Susan Anthony today at 9:21 AM and confirmed patient's identity using two patient identifiers. [place] results from yesterday were reviewed. Patient is scheduled for new OB visit at [facility] on 12/28/21. First trimester warning signs reviewed. Patient voiced understanding and had no further questions.   US OB Comp Less 14 Wks  Result Date: 12/24/2021 CLINICAL DATA:  Unsure of LMP.  Assess dating and viability. EXAM: OBSTETRIC <14 WK ULTRASOUND TECHNIQUE: Transabdominal ultrasound was performed for evaluation of the gestation as well as the maternal uterus and adnexal regions. COMPARISON:  None Available. FINDINGS: Intrauterine gestational sac: Single Yolk sac:  Not Visualized. Embryo:  Visualized. Cardiac Activity: Visualized. Heart Rate: 147 bpm CRL:   73 mm   13 w 3 d                  [place] EDC: 06/28/2022 Subchorionic hemorrhage:  None visualized. Maternal uterus/adnexae: Both ovaries are normal in appearance. No mass or abnormal free fluid identified. IMPRESSION: Single living IUP with estimated gestational age of [redacted] weeks 3 days, and [place] EDC of 06/28/2022. Electronically Signed   By: Danae Orleans M.D.   On: 12/24/2021 16:44    Marny Lowenstein, PA-C 12/25/2021 9:21 AM

## 2021-12-28 ENCOUNTER — Encounter: Payer: Self-pay | Admitting: Certified Nurse Midwife

## 2021-12-28 ENCOUNTER — Encounter: Payer: Self-pay | Admitting: Obstetrics

## 2021-12-28 ENCOUNTER — Ambulatory Visit (INDEPENDENT_AMBULATORY_CARE_PROVIDER_SITE_OTHER): Payer: Self-pay | Admitting: Certified Nurse Midwife

## 2021-12-28 VITALS — BP 119/79 | HR 93 | Wt 205.6 lb

## 2021-12-28 DIAGNOSIS — R109 Unspecified abdominal pain: Secondary | ICD-10-CM

## 2021-12-28 DIAGNOSIS — Z3A13 13 weeks gestation of pregnancy: Secondary | ICD-10-CM

## 2021-12-28 DIAGNOSIS — N390 Urinary tract infection, site not specified: Secondary | ICD-10-CM

## 2021-12-28 DIAGNOSIS — Z3402 Encounter for supervision of normal first pregnancy, second trimester: Secondary | ICD-10-CM

## 2021-12-28 DIAGNOSIS — B379 Candidiasis, unspecified: Secondary | ICD-10-CM

## 2021-12-28 DIAGNOSIS — Z3A14 14 weeks gestation of pregnancy: Secondary | ICD-10-CM

## 2021-12-28 LAB — POCT URINALYSIS DIPSTICK
Bilirubin, UA: NEGATIVE
Blood, UA: NEGATIVE
Glucose, UA: NEGATIVE
Nitrite, UA: NEGATIVE
Protein, UA: POSITIVE — AB
Spec Grav, UA: 1.015 (ref 1.010–1.025)
Urobilinogen, UA: 0.2 E.U./dL
pH, UA: 6.5 (ref 5.0–8.0)

## 2021-12-28 MED ORDER — TERCONAZOLE 0.4 % VA CREA: 1.0000 | TOPICAL_CREAM | Freq: Every day | VAGINAL | 0 refills | Status: DC

## 2021-12-28 NOTE — Progress Notes (Signed)
   PRENATAL VISIT NOTE  Subjective:  Susan Anthony is a 26 y.o. G2P1001 at [redacted]w[redacted]d being seen today for ongoing prenatal care.  She is currently monitored for the following issues for this low-risk pregnancy and has Supervision of normal first pregnancy, antepartum; Chlamydia infection affecting pregnancy; Normal labor and delivery; SVD (spontaneous vaginal delivery); and Supervision of other normal pregnancy, antepartum on their problem list.  Patient reports  lower abdominal cramping. Denies vaginal itching or irritation and urinary symptoms. She does endorses occasional back pain with urination, but otherwise normal.   Contractions: Not present. Vag. Bleeding: None.  Movement: Present. Denies leaking of fluid.   The following portions of the patient's history were reviewed and updated as appropriate: allergies, current medications, past family history, past medical history, past social history, past surgical history and problem list.   Objective:   Vitals:   12/28/21 0839  BP: 119/79  Pulse: 93  Weight: 205 lb 9.6 oz (93.3 kg)    Fetal Status: Fetal Heart Rate (bpm): 155   Movement: Present     General:  Alert, oriented and cooperative. Patient is in no acute distress.  Skin: Skin is warm and dry. No rash noted.   Cardiovascular: Normal heart rate noted  Respiratory: Normal respiratory effort, no problems with respiration noted  Abdomen: Soft, gravid, appropriate for gestational age.  Pain/Pressure: Present     Pelvic: Cervical exam deferred        Extremities: Normal range of motion.  Edema: Trace  Mental Status: Normal mood and affect. Normal behavior. Normal judgment and thought content.   Assessment and Plan:  Pregnancy: G2P1001 at [redacted]w[redacted]d 1. Encounter for supervision of low-risk first pregnancy in second trimester - Patient not feeling movement yet. Discussed that patient has up to [redacted] weeks gestation to begin to feel fetal movement. Patient reassured.   2. [redacted]  weeks gestation of pregnancy - Patient has a definite LMP of 09/21/21 via app on cell phone. EDD changed to Definite LMP 06/28/2022. Only 5 day difference from early [place].  - Patient request genetic testing at next visit.  - Patient ADOPT A MOM- Anatomy scan scheduled for [place].   3. Abdominal cramping 4. Yeast infection - Discussed that cramping can be cause by certain urinary or vaginal infections and may require treatment.  - Patient was seen for NOB 3 weeks ago and was positive for Yeast infection. She denied being notified or receiving treatment.  Dierdre Forth 7 sent to outpatient pharmacy.  - Encouraged the use of MyChart to be able to see results and messages from providers.  - Urinalysis - Urine Culture   Preterm labor symptoms and general obstetric precautions including but not limited to vaginal bleeding, contractions, leaking of fluid and fetal movement were reviewed in detail with the patient. Please refer to After Visit Summary for other counseling recommendations.   Return in about 4 weeks (around 01/25/2022) for LOB.  Future Appointments  Date Time Provider Department Center  01/25/2022 11:15 AM Raelyn Mora, CNM [facility] None   Kylene Zamarron (Danella Deis) Suzie Portela, MSN, CNM  Center for [facility]  12/28/21 12:12 PM

## 2021-12-28 NOTE — Progress Notes (Signed)
Pt presents for ROB visit. Pt c/o abdominal cramping.

## 2021-12-29 LAB — URINALYSIS
Bilirubin, UA: NEGATIVE
Glucose, UA: NEGATIVE
Nitrite, UA: NEGATIVE
RBC, UA: NEGATIVE
Specific Gravity, UA: 1.024 (ref 1.005–1.030)
Urobilinogen, Ur: 0.2 mg/dL (ref 0.2–1.0)
pH, UA: 7 (ref 5.0–7.5)

## 2021-12-29 MED ORDER — CEFADROXIL 500 MG PO CAPS: 500.0000 mg | ORAL_CAPSULE | Freq: Two times a day (BID) | ORAL | 0 refills | Status: AC

## 2021-12-29 NOTE — Addendum Note (Signed)
Addended by: Carlynn Herald on: 12/29/2021 10:59 AM   Modules accepted: Orders

## 2021-12-31 NOTE — Progress Notes (Signed)
TC to pt. Advised of UTI and yeast infection. Reports already picked up meds and is taking. Advised to finish entire course of meds and drink plenty of water. Pt verbalized understanding.

## 2022-01-25 ENCOUNTER — Ambulatory Visit (INDEPENDENT_AMBULATORY_CARE_PROVIDER_SITE_OTHER): Payer: Self-pay | Admitting: Obstetrics and Gynecology

## 2022-01-25 ENCOUNTER — Encounter: Payer: Self-pay | Admitting: Obstetrics

## 2022-01-25 ENCOUNTER — Encounter: Payer: Self-pay | Admitting: Obstetrics and Gynecology

## 2022-01-25 VITALS — BP 112/69 | HR 82 | Wt 205.0 lb

## 2022-01-25 DIAGNOSIS — Z3482 Encounter for supervision of other normal pregnancy, second trimester: Secondary | ICD-10-CM

## 2022-01-25 DIAGNOSIS — Z3480 Encounter for supervision of other normal pregnancy, unspecified trimester: Secondary | ICD-10-CM

## 2022-01-25 DIAGNOSIS — Z3A18 18 weeks gestation of pregnancy: Secondary | ICD-10-CM

## 2022-01-25 NOTE — Progress Notes (Unsigned)
   LOW-RISK PREGNANCY OFFICE VISIT Patient name: Susan Anthony MRN [id number]  Date of birth: 21-Mar-1995 Chief Complaint:   Routine Prenatal Visit  History of Present Illness:   Susan Anthony is a 26 y.o. G2P1001 female at [redacted]w[redacted]d with an Estimated Date of Delivery: 06/28/22 being seen today for ongoing management of a low-risk pregnancy.  Today she reports {sx:14538}. Contractions: Not present. Vag. Bleeding: None.  Movement: Present. {Actions; denies-reports:120008} leaking of fluid. Review of Systems:   Pertinent items are noted in HPI Denies abnormal vaginal discharge w/ itching/odor/irritation, headaches, visual changes, shortness of breath, chest pain, abdominal pain, severe nausea/vomiting, or problems with urination or bowel movements unless otherwise stated above. Pertinent History Reviewed:  Reviewed past medical,surgical, social, obstetrical and family history.  Reviewed problem list, medications and allergies. Physical Assessment:   Vitals:   01/25/22 1130  BP: 112/69  Pulse: 82  Weight: 205 lb (93 kg)  Body mass index is 38.73 kg/m.        Physical Examination:   General appearance: Well appearing, and in no distress  Mental status: Alert, oriented to person, place, and time  Skin: Warm & dry  Cardiovascular: Normal heart rate noted  Respiratory: Normal respiratory effort, no distress  Abdomen: Soft, gravid, nontender  Pelvic: {Blank single:19197::"Cervical exam performed","Cervical exam deferred"}         Extremities: Edema: None  Fetal Status: Fetal Heart Rate (bpm): 152   Movement: Present    No results found for this or any previous visit (from the past 24 hour(s)).  Assessment & Plan:  1) Low-risk pregnancy G2P1001 at [redacted]w[redacted]d with an Estimated Date of Delivery: 06/28/22   2) ***, ***   Meds: No orders of the defined types were placed in this encounter.  Labs/procedures today: ***  Plan:  Continue routine obstetrical care  ***  Reviewed: {Blank single:19197::"Term","Preterm"} labor symptoms and general obstetric precautions including but not limited to vaginal bleeding, contractions, leaking of fluid and fetal movement were reviewed in detail with the patient.  All questions were answered. *** home bp cuff. Rx faxed to ***. Check bp weekly, let us know if >140/90.   Follow-up: Return in about 4 weeks (around 02/22/2022) for Return OB visit.  Orders Placed This Encounter  Procedures   AFP, Serum, Open Spina Bifida   PANORAMA PRENATAL TEST FULL PANEL   Raelyn Mora MSN, CNM 01/25/2022 11:56 AM

## 2022-01-25 NOTE — Progress Notes (Signed)
Patient presents for ROB visit. No concerns at this time.   

## 2022-01-27 LAB — AFP, SERUM, OPEN SPINA BIFIDA
AFP MoM: 0.68
AFP Value: 24.6 ng/mL
Gest. Age on Collection Date: 18 weeks
Maternal Age At EDD: 26.7 yr
OSBR Risk 1 IN: 10000
Test Results:: NEGATIVE
Weight: 205 [lb_av]

## 2022-01-28 NOTE — L&D Delivery Note (Signed)
Delivery Note Susan Anthony is a 27 y.o. G2P1001 at [redacted]w[redacted]d admitted for IOL for decreased fetal movement at term.   GBS Status:  Negative/-- (05/09 1631)  Labor course: Initial SVE: 2/70/-2. Augmentation with: AROM and Cytotec. She then progressed to complete.  ROM: 9h 37m with clear fluid  Birth: Delivery of a Live born female  Birth Weight:   APGAR: 9, 9  Newborn Delivery   Birth date/time: 06/[birth year]/20[birth year] 14:46:00 Delivery type: Vaginal, Spontaneous         Delivered via spontaneous vaginal delivery (Presentation: ROA ). Nuchal cord present: No.  Shoulders and body delivered in usual fashion. Infant placed directly on mom's abdomen for bonding/skin-to-skin, baby dried and stimulated. Cord clamped x 2 after 1 minute and cut by FOB.  Cord blood collected. Right after delivery of the baby, she had a large gush of bright red blood.  Soon after, placenta delivered-Spontaneous  with 3 vessels . 20u Pitocin in 500cc LR given as a bolus prior delivery of placenta and TXA 1gm given IV. Fundus firm with massage. Placenta inspected and appears to be intact with a 3 VC.  Sponge and instrument count were correct x2.  Intrapartum complications:  None Anesthesia:  epidural Lacerations:  1st degree, vaginal, and labial Suture Repair: 2.0 vicryl and 4-0 monocryl EBL (mL):426.00     Mom to postpartum.  Baby to Couplet care / Skin to Skin. Placenta to L&D   Plans to Breastfeed Contraception: tubal ligation Circumcision: N/A  Note sent to [facility]: [facility] for pp visit.  Delivery Report:  Review the Delivery Report for details.     Signed: Jacklyn Shell, DNP,CNM 06/22/2022, 3:20 PM

## 2022-02-01 LAB — PANORAMA PRENATAL TEST FULL PANEL:PANORAMA TEST PLUS 5 ADDITIONAL MICRODELETIONS: FETAL FRACTION: 11

## 2022-02-22 ENCOUNTER — Encounter: Payer: Self-pay | Admitting: Obstetrics

## 2022-02-22 ENCOUNTER — Ambulatory Visit (INDEPENDENT_AMBULATORY_CARE_PROVIDER_SITE_OTHER): Payer: Self-pay | Admitting: Obstetrics

## 2022-02-22 VITALS — BP 122/75 | HR 96 | Wt 206.8 lb

## 2022-02-22 DIAGNOSIS — Z3A22 22 weeks gestation of pregnancy: Secondary | ICD-10-CM

## 2022-02-22 DIAGNOSIS — Z3482 Encounter for supervision of other normal pregnancy, second trimester: Secondary | ICD-10-CM

## 2022-02-22 DIAGNOSIS — Z3480 Encounter for supervision of other normal pregnancy, unspecified trimester: Secondary | ICD-10-CM

## 2022-02-22 NOTE — Progress Notes (Signed)
Subjective:  Susan Anthony is a 27 y.o. G2P1001 at [redacted]w[redacted]d being seen today for ongoing prenatal care.  She is currently monitored for the following issues for this low-risk pregnancy and has Chlamydia infection affecting pregnancy; Normal labor and delivery; SVD (spontaneous vaginal delivery); and Supervision of other normal pregnancy, antepartum on their problem list.  Patient reports heartburn.  Contractions: Not present. Vag. Bleeding: None.  Movement: Present. Denies leaking of fluid.   The following portions of the patient's history were reviewed and updated as appropriate: allergies, current medications, past family history, past medical history, past social history, past surgical history and problem list. Problem list updated.  Objective:   Vitals:   02/22/22 1057  BP: 122/75  Pulse: 96  Weight: 206 lb 12.8 oz (93.8 kg)    Fetal Status: Fetal Heart Rate (bpm): 148   Movement: Present     General:  Alert, oriented and cooperative. Patient is in no acute distress.  Skin: Skin is warm and dry. No rash noted.   Cardiovascular: Normal heart rate noted  Respiratory: Normal respiratory effort, no problems with respiration noted  Abdomen: Soft, gravid, appropriate for gestational age. Pain/Pressure: Absent     Pelvic:  Cervical exam deferred        Extremities: Normal range of motion.  Edema: Trace  Mental Status: Normal mood and affect. Normal behavior. Normal judgment and thought content.   Urinalysis:      Assessment and Plan:  Pregnancy: G2P1001 at [redacted]w[redacted]d  1. Supervision of other normal pregnancy, antepartum   Preterm labor symptoms and general obstetric precautions including but not limited to vaginal bleeding, contractions, leaking of fluid and fetal movement were reviewed in detail with the patient. Please refer to After Visit Summary for other counseling recommendations.   Return in about 4 weeks (around 03/22/2022) for ROB, 2 hour OGTT.   Shelly Bombard, MD 02/22/2022

## 2022-02-22 NOTE — Progress Notes (Signed)
Pt presents for ROB visit. No concerns at this time.  

## 2022-03-22 ENCOUNTER — Encounter: Payer: Self-pay | Admitting: Medical

## 2022-03-27 ENCOUNTER — Encounter: Payer: Self-pay | Admitting: Obstetrics and Gynecology

## 2022-03-27 ENCOUNTER — Ambulatory Visit (INDEPENDENT_AMBULATORY_CARE_PROVIDER_SITE_OTHER): Payer: Self-pay | Admitting: Obstetrics and Gynecology

## 2022-03-27 VITALS — BP 119/76 | HR 86 | Wt 210.2 lb

## 2022-03-27 DIAGNOSIS — Z3A26 26 weeks gestation of pregnancy: Secondary | ICD-10-CM

## 2022-03-27 DIAGNOSIS — Z3480 Encounter for supervision of other normal pregnancy, unspecified trimester: Secondary | ICD-10-CM

## 2022-03-27 DIAGNOSIS — O26842 Uterine size-date discrepancy, second trimester: Secondary | ICD-10-CM

## 2022-03-27 DIAGNOSIS — O99212 Obesity complicating pregnancy, second trimester: Secondary | ICD-10-CM

## 2022-03-27 NOTE — Progress Notes (Signed)
   LOW-RISK PREGNANCY OFFICE VISIT Patient name: Susan Anthony MRN [id number]  Date of birth: [birth year] Chief Complaint:   Routine Prenatal Visit  History of Present Illness:   Susan Anthony is a 27 y.o. G2P1001 female at 74w5dwith an Estimated Date of Delivery: 06/28/22 being seen today for ongoing management of a low-risk pregnancy.  Today she reports  she has noticed that her neck is darkening up and she is afraid that she will have GDM .She also states, "I feel like my belly is going to pop already. Like it's much bigger than I was in my last pregnancy." Contractions: Not present. Vag. Bleeding: None.  Movement: Present. denies leaking of fluid. Review of Systems:   Pertinent items are noted in HPI Denies abnormal vaginal discharge w/ itching/odor/irritation, headaches, visual changes, shortness of breath, chest pain, abdominal pain, severe nausea/vomiting, or problems with urination or bowel movements unless otherwise stated above. Pertinent History Reviewed:  Reviewed past medical,surgical, social, obstetrical and family history.  Reviewed problem list, medications and allergies. Physical Assessment:   Vitals:   03/27/22 1339  BP: 119/76  Pulse: 86  Weight: 210 lb 3.2 oz (95.3 kg)  Body mass index is 39.72 kg/m.        Physical Examination:   General appearance: Well appearing, and in no distress  Mental status: Alert, oriented to person, place, and time  Skin: Warm & dry  Cardiovascular: Normal heart rate noted  Respiratory: Normal respiratory effort, no distress  Abdomen: Soft, gravid, nontender  Pelvic: Cervical exam deferred         Extremities: Edema: Trace  Fetal Status: Fetal Heart Rate (bpm): 146 Fundal Height: 30 cm Movement: Present Presentation: Undeterminable  No results found for this or any previous visit (from the past 24 hour(s)).  Assessment & Plan:  1) Low-risk pregnancy G2P1001 at 222w5dith an Estimated Date of Delivery:  06/28/22   2) Supervision of other normal pregnancy, antepartum - Anticipatory guidance for 2 hr GTT - advised to fast after midnight without anything to eat or drink (except for water), will have fasting blood drawn, drink the glucola drink (flavor choices: orange or fruit punch), have a visit with a provider during the first hour of testing, wait in the lab waiting room to have blood drawn at 1 hour and then 2 hours after finishing glucola drink.   3) Obesity affecting pregnancy in second trimester, unspecified obesity type - Taking daily bASA  4) Uterine size date discrepancy pregnancy, second trimester - Plan growth U/S at 36 wks - Explained how her habitus can affect FH  5) [redacted] weeks gestation of pregnancy    Meds: No orders of the defined types were placed in this encounter.  Labs/procedures today: none  Plan:  Continue routine obstetrical care   Reviewed: Preterm labor symptoms and general obstetric precautions including but not limited to vaginal bleeding, contractions, leaking of fluid and fetal movement were reviewed in detail with the patient.  All questions were answered. Has home bp cuff. Check bp weekly, let usKoreanow if >140/90.   Follow-up: Return in about 2 weeks (around 04/10/2022) for Return OB 2hr GTT.  No orders of the defined types were placed in this encounter.  RoLaury DeepSN, CNM 03/27/2022 2:18 PM

## 2022-03-27 NOTE — Patient Instructions (Signed)
Oral Glucose Tolerance Test During Pregnancy Why am I having this test? The oral glucose tolerance test (OGTT) is done to check how your body processes blood sugar (glucose). This is one of several tests used to diagnose diabetes that develops during pregnancy (gestational diabetes mellitus). Gestational diabetes is a short-term form of diabetes that some women develop while they are pregnant. It usually occurs during the second trimester of pregnancy and goes away after delivery. Testing, or screening, for gestational diabetes usually occurs at weeks 24-28 of pregnancy. You may have the OGTT test after having a 1-hour glucose screening test if the results from that test indicate that you may have gestational diabetes. This test may also be needed if: You have a history of gestational diabetes. There is a history of giving birth to very large babies or of losing pregnancies (having stillbirths). You have signs and symptoms of diabetes, such as: Changes in your eyesight. Tingling or numbness in your hands or feet. Changes in hunger, thirst, and urination, and these are not explained by your pregnancy. What is being tested? This test measures the amount of glucose in your blood at different times during a period of 3 hours. This shows how well your body can process glucose. What kind of sample is taken?  Blood samples are required for this test. They are usually collected by inserting a needle into a blood vessel. How do I prepare for this test? For 3 days before your test, eat normally. Have plenty of carbohydrate-rich foods. Follow instructions from your health care provider about: Eating or drinking restrictions on the day of the test. You may be asked not to eat or drink anything other than water (to fast) starting 8-10 hours before the test. Changing or stopping your regular medicines. Some medicines may interfere with this test. Tell a health care provider about: All medicines you are  taking, including vitamins, herbs, eye drops, creams, and over-the-counter medicines. Any blood disorders you have. Any surgeries you have had. Any medical conditions you have. What happens during the test? First, your blood glucose will be measured. This is referred to as your fasting blood glucose because you fasted before the test. Then, you will drink a glucose solution that contains a certain amount of glucose. Your blood glucose will be measured again 1, 2, and 3 hours after you drink the solution. This test takes about 3 hours to complete. You will need to stay at the testing location during this time. During the testing period: Do not eat or drink anything other than the glucose solution. Do not exercise. Do not use any products that contain nicotine or tobacco, such as cigarettes, e-cigarettes, and chewing tobacco. These can affect your test results. If you need help quitting, ask your health care provider. The testing procedure may vary among health care providers and hospitals. How are the results reported? Your results will be reported as milligrams of glucose per deciliter of blood (mg/dL) or millimoles per liter (mmol/L). There is more than one source for screening and diagnosis reference values used to diagnose gestational diabetes. Your health care provider will compare your results to normal values that were established after testing a large group of people (reference values). Reference values may vary among labs and hospitals. For this test ([facility]arpenter-Coustan), reference values are: Fasting: 95 mg/dL (5.3 mmol/L). 1 hour: 180 mg/dL (10.0 mmol/L). 2 hour: 155 mg/dL (8.6 mmol/L). 3 hour: 140 mg/dL (7.8 mmol/L). What do the results mean? Results below the reference values are  considered normal. If two or more of your blood glucose levels are at or above the reference values, you may be diagnosed with gestational diabetes. If only one level is high, your health care provider may  suggest repeat testing or other tests to confirm a diagnosis. Talk with your health care provider about what your results mean. Questions to ask your health care provider Ask your health care provider, or the department that is doing the test: When will my results be ready? How will I get my results? What are my treatment options? What other tests do I need? What are my next steps? Summary The oral glucose tolerance test (OGTT) is one of several tests used to diagnose diabetes that develops during pregnancy (gestational diabetes mellitus). Gestational diabetes is a short-term form of diabetes that some women develop while they are pregnant. You may have the OGTT test after having a 1-hour glucose screening test if the results from that test show that you may have gestational diabetes. You may also have this test if you have any symptoms or risk factors for this type of diabetes. Talk with your health care provider about what your results mean. This information is not intended to replace advice given to you by your health care provider. Make sure you discuss any questions you have with your health care provider. Document Revised: 08/21/2021 Document Reviewed: 06/24/2019 Elsevier Patient Education  [place].

## 2022-03-27 NOTE — Progress Notes (Signed)
Pt presents for ROB visit. No concerns at this time.

## 2022-04-08 ENCOUNTER — Inpatient Hospital Stay (HOSPITAL_COMMUNITY)
Admission: AD | Admit: 2022-04-08 | Discharge: 2022-04-08 | Disposition: A | Discharge status: Home / Self Care | Payer: No Typology Code available for payment source | Attending: Family Medicine | Admitting: Family Medicine

## 2022-04-08 ENCOUNTER — Other Ambulatory Visit: Payer: Self-pay

## 2022-04-08 ENCOUNTER — Encounter (HOSPITAL_COMMUNITY): Payer: Self-pay | Admitting: Family Medicine

## 2022-04-08 DIAGNOSIS — O9A213 Injury, poisoning and certain other consequences of external causes complicating pregnancy, third trimester: Secondary | ICD-10-CM | POA: Insufficient documentation

## 2022-04-08 DIAGNOSIS — R1030 Lower abdominal pain, unspecified: Secondary | ICD-10-CM | POA: Insufficient documentation

## 2022-04-08 DIAGNOSIS — Z3A28 28 weeks gestation of pregnancy: Secondary | ICD-10-CM | POA: Diagnosis not present

## 2022-04-08 DIAGNOSIS — M7918 Myalgia, other site: Secondary | ICD-10-CM

## 2022-04-08 DIAGNOSIS — O26893 Other specified pregnancy related conditions, third trimester: Secondary | ICD-10-CM | POA: Insufficient documentation

## 2022-04-08 MED ORDER — CYCLOBENZAPRINE HCL 10 MG PO TABS: 10.0000 mg | ORAL_TABLET | Freq: Two times a day (BID) | ORAL | 0 refills | Status: DC | PRN

## 2022-04-08 MED ORDER — ACETAMINOPHEN 500 MG PO TABS
1000.0000 mg | ORAL_TABLET | Freq: Once | ORAL | Status: AC
  Administered 2022-04-08: 1000 mg via ORAL
  Filled 2022-04-08: qty 2

## 2022-04-08 NOTE — Discharge Instructions (Signed)
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
## 2022-04-11 ENCOUNTER — Encounter: Payer: Self-pay | Admitting: Obstetrics

## 2022-04-11 ENCOUNTER — Other Ambulatory Visit: Payer: Self-pay

## 2022-04-11 ENCOUNTER — Ambulatory Visit (INDEPENDENT_AMBULATORY_CARE_PROVIDER_SITE_OTHER): Payer: Self-pay | Admitting: Obstetrics

## 2022-04-11 VITALS — BP 116/74 | HR 94 | Wt 211.2 lb

## 2022-04-11 DIAGNOSIS — Z3480 Encounter for supervision of other normal pregnancy, unspecified trimester: Secondary | ICD-10-CM

## 2022-04-11 DIAGNOSIS — Z3A28 28 weeks gestation of pregnancy: Secondary | ICD-10-CM

## 2022-04-11 DIAGNOSIS — Z3483 Encounter for supervision of other normal pregnancy, third trimester: Secondary | ICD-10-CM

## 2022-04-11 DIAGNOSIS — Z23 Encounter for immunization: Secondary | ICD-10-CM

## 2022-04-11 NOTE — Progress Notes (Deleted)
Pt presents for ROB visit.  

## 2022-04-11 NOTE — Progress Notes (Signed)
Subjective:  Susan Anthony is a 27 y.o. G2P1001 at 3w6dbeing seen today for ongoing prenatal care.  She is currently monitored for the following issues for this low-risk pregnancy and has Chlamydia infection affecting pregnancy; Normal labor and delivery; SVD (spontaneous vaginal delivery); and Supervision of other normal pregnancy, antepartum on their problem list.  Patient reports no complaints.  Contractions: Not present. Vag. Bleeding: None.  Movement: Present. Denies leaking of fluid.   The following portions of the patient's history were reviewed and updated as appropriate: allergies, current medications, past family history, past medical history, past social history, past surgical history and problem list. Problem list updated.  Objective:   Vitals:   04/11/22 1043  BP: 116/74  Pulse: 94  Weight: 211 lb 3.2 oz (95.8 kg)    Fetal Status: Fetal Heart Rate (bpm): 145   Movement: Present     General:  Alert, oriented and cooperative. Patient is in no acute distress.  Skin: Skin is warm and dry. No rash noted.   Cardiovascular: Normal heart rate noted  Respiratory: Normal respiratory effort, no problems with respiration noted  Abdomen: Soft, gravid, appropriate for gestational age. Pain/Pressure: Present     Pelvic:  Cervical exam deferred        Extremities: Normal range of motion.  Edema: Trace  Mental Status: Normal mood and affect. Normal behavior. Normal judgment and thought content.   Urinalysis:      Assessment and Plan:  Pregnancy: G2P1001 at 26w6d1. Supervision of other normal pregnancy, antepartum Rx: - Glucose Tolerance, 2 Hours w/1 Hour - RPR - CBC - HIV antibody (with reflex)  Preterm labor symptoms and general obstetric precautions including but not limited to vaginal bleeding, contractions, leaking of fluid and fetal movement were reviewed in detail with the patient. Please refer to After Visit Summary for other counseling recommendations.    Return in about 2 weeks (around 04/25/2022) for ROB.   HaShelly BombardMD 04/11/22
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
## 2022-04-12 ENCOUNTER — Other Ambulatory Visit: Payer: Self-pay

## 2022-04-12 ENCOUNTER — Encounter: Payer: Self-pay | Admitting: Obstetrics

## 2022-04-12 LAB — RPR: RPR Ser Ql: NONREACTIVE

## 2022-04-12 LAB — CBC
Hematocrit: 33.8 % — ABNORMAL LOW (ref 34.0–46.6)
Hemoglobin: 11.1 g/dL (ref 11.1–15.9)
MCH: 30 pg (ref 26.6–33.0)
MCHC: 32.8 g/dL (ref 31.5–35.7)
MCV: 91 fL (ref 79–97)
Platelets: 160 10*3/uL (ref 150–450)
RBC: 3.7 x10E6/uL — ABNORMAL LOW (ref 3.77–5.28)
RDW: 12.8 % (ref 11.7–15.4)
WBC: 7.9 10*3/uL (ref 3.4–10.8)

## 2022-04-12 LAB — GLUCOSE TOLERANCE, 2 HOURS W/ 1HR
Glucose, 1 hour: 138 mg/dL (ref 70–179)
Glucose, 2 hour: 78 mg/dL (ref 70–152)
Glucose, Fasting: 76 mg/dL (ref 70–91)

## 2022-04-12 LAB — HIV ANTIBODY (ROUTINE TESTING W REFLEX): HIV Screen 4th Generation wRfx: NONREACTIVE

## 2022-04-25 ENCOUNTER — Encounter: Payer: Self-pay | Admitting: Obstetrics

## 2022-04-25 ENCOUNTER — Ambulatory Visit (INDEPENDENT_AMBULATORY_CARE_PROVIDER_SITE_OTHER): Payer: Self-pay | Admitting: Obstetrics

## 2022-04-25 VITALS — BP 114/76 | HR 87 | Wt 211.7 lb

## 2022-04-25 DIAGNOSIS — Z3A30 30 weeks gestation of pregnancy: Secondary | ICD-10-CM

## 2022-04-25 DIAGNOSIS — Z3480 Encounter for supervision of other normal pregnancy, unspecified trimester: Secondary | ICD-10-CM

## 2022-04-25 DIAGNOSIS — O99213 Obesity complicating pregnancy, third trimester: Secondary | ICD-10-CM

## 2022-04-25 DIAGNOSIS — O99212 Obesity complicating pregnancy, second trimester: Secondary | ICD-10-CM

## 2022-04-25 NOTE — Progress Notes (Signed)
Subjective:  Susan Anthony is a 27 y.o. G2P1001 at [redacted]w[redacted]d being seen today for ongoing prenatal care.  She is currently monitored for the following issues for this low-risk pregnancy and has Chlamydia infection affecting pregnancy; Normal labor and delivery; SVD (spontaneous vaginal delivery); and Supervision of other normal pregnancy, antepartum on their problem list.  Patient reports no complaints.  Contractions: Irritability. Vag. Bleeding: None.  Movement: Present. Denies leaking of fluid.   The following portions of the patient's history were reviewed and updated as appropriate: allergies, current medications, past family history, past medical history, past social history, past surgical history and problem list. Problem list updated.  Objective:   Vitals:   04/25/22 1537  Weight: 211 lb 11.2 oz (96 kg)    Fetal Status:     Movement: Present     General:  Alert, oriented and cooperative. Patient is in no acute distress.  Skin: Skin is warm and dry. No rash noted.   Cardiovascular: Normal heart rate noted  Respiratory: Normal respiratory effort, no problems with respiration noted  Abdomen: Soft, gravid, appropriate for gestational age. Pain/Pressure: Absent     Pelvic:  Cervical exam deferred        Extremities: Normal range of motion.  Edema: None  Mental Status: Normal mood and affect. Normal behavior. Normal judgment and thought content.   Urinalysis:      Assessment and Plan:  Pregnancy: G2P1001 at [redacted]w[redacted]d  1. Supervision of other normal pregnancy, antepartum  2. Obesity affecting pregnancy in second trimester, unspecified obesity type    Preterm labor symptoms and general obstetric precautions including but not limited to vaginal bleeding, contractions, leaking of fluid and fetal movement were reviewed in detail with the patient. Please refer to After Visit Summary for other counseling recommendations.   Return in about 2 weeks (around 05/09/2022) for  ROB.   Shelly Bombard, MD 04/25/22

## 2022-05-09 ENCOUNTER — Ambulatory Visit (INDEPENDENT_AMBULATORY_CARE_PROVIDER_SITE_OTHER): Payer: Self-pay | Admitting: Student

## 2022-05-09 ENCOUNTER — Encounter: Payer: Self-pay | Admitting: Student

## 2022-05-09 VITALS — BP 113/78 | HR 95 | Wt 213.2 lb

## 2022-05-09 DIAGNOSIS — O99212 Obesity complicating pregnancy, second trimester: Secondary | ICD-10-CM

## 2022-05-09 DIAGNOSIS — Z3480 Encounter for supervision of other normal pregnancy, unspecified trimester: Secondary | ICD-10-CM

## 2022-05-09 DIAGNOSIS — R519 Headache, unspecified: Secondary | ICD-10-CM

## 2022-05-09 DIAGNOSIS — Z3A32 32 weeks gestation of pregnancy: Secondary | ICD-10-CM

## 2022-05-09 MED ORDER — BUTALBITAL-APAP-CAFFEINE 50-325-40 MG PO CAPS: 1.0000 | ORAL_CAPSULE | Freq: Four times a day (QID) | ORAL | 0 refills | Status: DC | PRN

## 2022-05-09 NOTE — Progress Notes (Signed)
   PRENATAL VISIT NOTE  Subjective:  Susan Anthony is a 27 y.o. G2P1001 at [redacted]w[redacted]d being seen today for ongoing prenatal care.  She is currently monitored for the following issues for this low-risk pregnancy and has Chlamydia infection affecting pregnancy; Normal labor and delivery; SVD (spontaneous vaginal delivery); and Supervision of other normal pregnancy, antepartum on their problem list.  Patient reports headache.  States headache started 4 days ago. Feels pressure bilaterally on forehead. Tylenol reduces the pain, but does not eliminate her headache. Patient states she has been drinking ~150oz of water per day and has tried eliminated sugars from her diet. Headache is worse in the morning time when patient first wakes up. Denies blurred vision, n/v, elevated Bps at home, dizziness, congestion, runny nose or other flu like symptoms. Contractions: Irritability. Vag. Bleeding: None.  Movement: Present. Denies leaking of fluid.  Cramping in lower abdomen bilaterally that occurs 1-2 times a day without obvious exasperating factors.  The following portions of the patient's history were reviewed and updated as appropriate: allergies, current medications, past family history, past medical history, past social history, past surgical history and problem list.   Objective:   Vitals:   05/09/22 1616  BP: 113/78  Pulse: 95  Weight: 213 lb 3.2 oz (96.7 kg)    Fetal Status: Fetal Heart Rate (bpm): 144   Movement: Present     General:  Alert, oriented and cooperative. Patient is in no acute distress.  Skin: Skin is warm and dry. No rash noted.   Cardiovascular: Normal heart rate noted  Respiratory: Normal respiratory effort, no problems with respiration noted  Abdomen: Soft, gravid, appropriate for gestational age.  Pain/Pressure: Present     Pelvic: Cervical exam deferred        Extremities: Normal range of motion.  Edema: Trace  Mental Status: Normal mood and affect. Normal  behavior. Normal judgment and thought content.   Assessment and Plan:  Pregnancy: G2P1001 at [redacted]w[redacted]d 1. Supervision of other normal pregnancy, antepartum - Overall doing well, frequent fetal movement - Described difference between Braxton-Hicks contractions vs. signs of early labor  2. [redacted] weeks gestation of pregnancy - continue routine follow-up care  3. Obesity affecting pregnancy in second trimester, unspecified obesity type - Pre-gravid BMI 34  4. New onset of headaches - Fioricet prescribed. Counseled to seek urgent medical attention if headache persists or develops blurred vision, dizziness, n/v, elevated BP w/HA, or worsening pain unrelieved by medication. Encouraged continued hydration and rest. Plan to follow-up at next visit or sooner if problem persists. - CBC w/Diff - Comprehensive metabolic panel  Preterm labor symptoms and general obstetric precautions including but not limited to vaginal bleeding, contractions, leaking of fluid and fetal movement were reviewed in detail with the patient. Please refer to After Visit Summary for other counseling recommendations.   Return in about 2 weeks (around 05/23/2022) for LOB, IN-PERSON.  Future Appointments  Date Time Provider Department Center  05/23/2022  4:10 PM Corlis Hove, NP [facility] None  06/06/2022  4:10 PM Anyanwu, Jethro Bastos, MD [facility] None  06/13/2022  4:10 PM Gerrit Heck, CNM [facility] None  06/20/2022  4:10 PM Gerrit Heck, CNM [facility] None  06/27/2022  4:10 PM Marny Lowenstein, PA-C [facility] None    Corlis Hove, NP

## 2022-05-09 NOTE — Progress Notes (Signed)
Pt presents for ROB visit. Pt reports headaches with not much relief from tylenol and cramping in the lower abdomin that radiate to the back. No other concerns.

## 2022-05-10 LAB — COMPREHENSIVE METABOLIC PANEL WITH GFR
ALT: 20 IU/L (ref 0–32)
AST: 18 IU/L (ref 0–40)
Albumin/Globulin Ratio: 1.4 (ref 1.2–2.2)
Albumin: 3.5 g/dL — ABNORMAL LOW (ref 4.0–5.0)
Alkaline Phosphatase: 172 IU/L — ABNORMAL HIGH (ref 44–121)
BUN/Creatinine Ratio: 10 (ref 9–23)
BUN: 5 mg/dL — ABNORMAL LOW (ref 6–20)
Bilirubin Total: <0.2 mg/dL (ref 0.0–1.2)
CO2: 20 mmol/L (ref 20–29)
Calcium: 9.1 mg/dL (ref 8.7–10.2)
Chloride: 106 mmol/L (ref 96–106)
Creatinine, Ser: 0.52 mg/dL — ABNORMAL LOW (ref 0.57–1.00)
Globulin, Total: 2.5 g/dL (ref 1.5–4.5)
Glucose: 84 mg/dL (ref 70–99)
Potassium: 3.8 mmol/L (ref 3.5–5.2)
Sodium: 138 mmol/L (ref 134–144)
Total Protein: 6 g/dL (ref 6.0–8.5)
eGFR: 131 mL/min/1.73

## 2022-05-10 LAB — CBC WITH DIFFERENTIAL/PLATELET
Basophils Absolute: 0 x10E3/uL (ref 0.0–0.2)
Basos: 0 %
EOS (ABSOLUTE): 0.1 x10E3/uL (ref 0.0–0.4)
Eos: 1 %
Hematocrit: 31.7 % — ABNORMAL LOW (ref 34.0–46.6)
Hemoglobin: 10.5 g/dL — ABNORMAL LOW (ref 11.1–15.9)
Immature Grans (Abs): 0 x10E3/uL (ref 0.0–0.1)
Immature Granulocytes: 0 %
Lymphocytes Absolute: 1.9 x10E3/uL (ref 0.7–3.1)
Lymphs: 23 %
MCH: 29.5 pg (ref 26.6–33.0)
MCHC: 33.1 g/dL (ref 31.5–35.7)
MCV: 89 fL (ref 79–97)
Monocytes Absolute: 0.6 x10E3/uL (ref 0.1–0.9)
Monocytes: 7 %
Neutrophils Absolute: 5.7 x10E3/uL (ref 1.4–7.0)
Neutrophils: 69 %
Platelets: 148 x10E3/uL — ABNORMAL LOW (ref 150–450)
RBC: 3.56 x10E6/uL — ABNORMAL LOW (ref 3.77–5.28)
RDW: 12.6 % (ref 11.7–15.4)
WBC: 8.3 x10E3/uL (ref 3.4–10.8)

## 2022-05-13 ENCOUNTER — Other Ambulatory Visit: Payer: Self-pay | Admitting: Student

## 2022-05-13 ENCOUNTER — Encounter: Payer: Self-pay | Admitting: Student

## 2022-05-13 DIAGNOSIS — D696 Thrombocytopenia, unspecified: Secondary | ICD-10-CM | POA: Insufficient documentation

## 2022-05-13 DIAGNOSIS — O99113 Other diseases of the blood and blood-forming organs and certain disorders involving the immune mechanism complicating pregnancy, third trimester: Secondary | ICD-10-CM | POA: Insufficient documentation

## 2022-05-13 DIAGNOSIS — O99013 Anemia complicating pregnancy, third trimester: Secondary | ICD-10-CM

## 2022-05-13 MED ORDER — FERROUS SULFATE 325 (65 FE) MG PO TBEC: 325.0000 mg | DELAYED_RELEASE_TABLET | ORAL | 1 refills | Status: DC

## 2022-05-23 ENCOUNTER — Ambulatory Visit (INDEPENDENT_AMBULATORY_CARE_PROVIDER_SITE_OTHER): Payer: Self-pay | Admitting: Student

## 2022-05-23 ENCOUNTER — Encounter: Payer: Self-pay | Admitting: Student

## 2022-05-23 VITALS — BP 119/75 | HR 85 | Wt 213.4 lb

## 2022-05-23 DIAGNOSIS — Z3A34 34 weeks gestation of pregnancy: Secondary | ICD-10-CM

## 2022-05-23 DIAGNOSIS — Z3480 Encounter for supervision of other normal pregnancy, unspecified trimester: Secondary | ICD-10-CM

## 2022-05-23 DIAGNOSIS — O99212 Obesity complicating pregnancy, second trimester: Secondary | ICD-10-CM

## 2022-05-23 DIAGNOSIS — O99013 Anemia complicating pregnancy, third trimester: Secondary | ICD-10-CM

## 2022-05-23 DIAGNOSIS — O26842 Uterine size-date discrepancy, second trimester: Secondary | ICD-10-CM

## 2022-05-23 NOTE — Progress Notes (Signed)
Pt presents for ROB visit.  

## 2022-05-26 NOTE — Progress Notes (Unsigned)
   PRENATAL VISIT NOTE  Subjective:  Susan Anthony is a 27 y.o. G2P1001 at [redacted]w[redacted]d being seen today for ongoing prenatal care.  She is currently monitored for the following issues for this {Blank single:19197::"high-risk","low-risk"} pregnancy and has Chlamydia infection affecting pregnancy; Normal labor and delivery; SVD (spontaneous vaginal delivery); Supervision of other normal pregnancy, antepartum; Benign gestational thrombocytopenia in third trimester [facility]); and Anemia affecting pregnancy in third trimester on their problem list.  Patient reports {sx:14538}.  Contractions: Not present. Vag. Bleeding: None.  Movement: Present. Denies leaking of fluid.   The following portions of the patient's history were reviewed and updated as appropriate: allergies, current medications, past family history, past medical history, past social history, past surgical history and problem list.   Objective:   Vitals:   05/23/22 1631  BP: 119/75  Pulse: 85  Weight: 213 lb 6.4 oz (96.8 kg)    Fetal Status:     Movement: Present     General:  Alert, oriented and cooperative. Patient is in no acute distress.  Skin: Skin is warm and dry. No rash noted.   Cardiovascular: Normal heart rate noted  Respiratory: Normal respiratory effort, no problems with respiration noted  Abdomen: Soft, gravid, appropriate for gestational age.  Pain/Pressure: Present     Pelvic: {Blank single:19197::"Cervical exam performed in the presence of a chaperone","Cervical exam deferred"}        Extremities: Normal range of motion.  Edema: Trace  Mental Status: Normal mood and affect. Normal behavior. Normal judgment and thought content.   Assessment and Plan:  Pregnancy: G2P1001 at [redacted]w[redacted]d 1. Supervision of other normal pregnancy, antepartum ***  2. [redacted] weeks gestation of pregnancy ***  3. Anemia affecting pregnancy in third trimester ***  4. Obesity affecting pregnancy in second trimester, unspecified obesity  type ***  5. Uterine size date discrepancy pregnancy, second trimester *** - [place] MFM OB FOLLOW UP; Future  {Blank single:19197::"Term","Preterm"} labor symptoms and general obstetric precautions including but not limited to vaginal bleeding, contractions, leaking of fluid and fetal movement were reviewed in detail with the patient. Please refer to After Visit Summary for other counseling recommendations.   Return in about 2 weeks (around 06/06/2022) for LOB/GBS, IN-PERSON.  Future Appointments  Date Time Provider Department Center  06/06/2022  4:10 PM Anyanwu, Jethro Bastos, MD [facility] None  06/13/2022  4:10 PM Gerrit Heck, CNM [facility] None  06/20/2022  4:10 PM Gerrit Heck, CNM [facility] None  06/27/2022  4:10 PM Marny Lowenstein, PA-C [facility] None    Corlis Hove, NP

## 2022-06-06 ENCOUNTER — Encounter: Payer: Self-pay | Admitting: Obstetrics

## 2022-06-06 ENCOUNTER — Ambulatory Visit (INDEPENDENT_AMBULATORY_CARE_PROVIDER_SITE_OTHER): Payer: Self-pay | Admitting: Obstetrics & Gynecology

## 2022-06-06 ENCOUNTER — Encounter: Payer: Self-pay | Admitting: Obstetrics & Gynecology

## 2022-06-06 ENCOUNTER — Other Ambulatory Visit (HOSPITAL_COMMUNITY)
Admission: RE | Admit: 2022-06-06 | Discharge: 2022-06-06 | Disposition: A | Discharge status: Home / Self Care | Payer: Self-pay | Source: Ambulatory Visit | Attending: Obstetrics & Gynecology | Admitting: Obstetrics & Gynecology

## 2022-06-06 VITALS — BP 117/78 | HR 97 | Wt 215.7 lb

## 2022-06-06 DIAGNOSIS — D696 Thrombocytopenia, unspecified: Secondary | ICD-10-CM

## 2022-06-06 DIAGNOSIS — Z3480 Encounter for supervision of other normal pregnancy, unspecified trimester: Secondary | ICD-10-CM

## 2022-06-06 DIAGNOSIS — Z3A36 36 weeks gestation of pregnancy: Secondary | ICD-10-CM | POA: Insufficient documentation

## 2022-06-06 DIAGNOSIS — O99113 Other diseases of the blood and blood-forming organs and certain disorders involving the immune mechanism complicating pregnancy, third trimester: Secondary | ICD-10-CM

## 2022-06-06 DIAGNOSIS — O99013 Anemia complicating pregnancy, third trimester: Secondary | ICD-10-CM

## 2022-06-06 DIAGNOSIS — Z3483 Encounter for supervision of other normal pregnancy, third trimester: Secondary | ICD-10-CM | POA: Insufficient documentation

## 2022-06-06 NOTE — Progress Notes (Signed)
PRENATAL VISIT NOTE  Subjective:  Susan Anthony is a 26 y.o. G2P1001 at [redacted]w[redacted]d being seen today for ongoing prenatal care.  She is currently monitored for the following issues for this low-risk pregnancy and has Supervision of other normal pregnancy, antepartum; Benign gestational thrombocytopenia in third trimester [facility]); and Anemia affecting pregnancy in third trimester on their problem list.  Patient reports occasional contractions, desires cervical check.  Contractions: Irritability. Vag. Bleeding: None.  Movement: Present. Denies leaking of fluid.   The following portions of the patient's history were reviewed and updated as appropriate: allergies, current medications, past family history, past medical history, past social history, past surgical history and problem list.   Objective:   Vitals:   06/06/22 1602  BP: 117/78  Pulse: 97  Weight: 215 lb 11.2 oz (97.8 kg)    Fetal Status: Fetal Heart Rate (bpm): 135 Fundal Height: 41 cm Movement: Present  Presentation: Vertex  General:  Alert, oriented and cooperative. Patient is in no acute distress.  Skin: Skin is warm and dry. No rash noted.   Cardiovascular: Normal heart rate noted  Respiratory: Normal respiratory effort, no problems with respiration noted  Abdomen: Soft, gravid, appropriate for gestational age.  Pain/Pressure: Present     Pelvic: Cervical exam performed in the presence of a chaperone Dilation: 1.5 Effacement (%): 70 Station: -3. Culture samples obtained.  Extremities: Normal range of motion.  Edema: Trace  Mental Status: Normal mood and affect. Normal behavior. Normal judgment and thought content.   Assessment and Plan:  Pregnancy: G2P1001 at [redacted]w[redacted]d 1. Benign gestational thrombocytopenia in third trimester [facility]) 148K recently, will check again in labor.  2. Anemia affecting pregnancy in third trimester Hgb 10.5, or oral iron therapy.  3. [redacted] weeks gestation of pregnancy Pelvic cultures  done. - Culture, beta strep (group b only) - Cervicovaginal ancillary only( [place])  4. Supervision of other normal pregnancy, antepartum Growth scan scheduled for tomorrow at [facility], will follow up results and manage accordingly.  Patient said she desires permanent sterilization. Other reversible forms of contraception including the most effective LARCs such as IUD or Nexplanon were discussed with patient; she declines all other modalities. Also discussed vasectomy, after being counseled about this procedure being less invasive and more effective.   Details of postpartum tubal sterilization discussed in detail.   She was told that this will be performed as either salpingectomy or occlusion with Filshie clips, depending on difficulty of procedure, exposure and other factors.  Risks of procedure discussed with patient including but not limited to: risk of regret, permanence of method, bleeding, infection, injury to surrounding organs and need for additional procedures.  Failure risk of about 1% with increased risk of ectopic gestation if pregnancy occurs was also discussed with patient.   Also discussed possibility of post-tubal syndrome with increased pelvic pain or menstrual irregularities. Patient verbalized understanding of these risks and is unsure if she wants to proceed with sterilization.  Information given to her to review.  She is also considering Paragard.  Information given to her about costs of procedure.   Labor symptoms and general obstetric precautions including but not limited to vaginal bleeding, contractions, leaking of fluid and fetal movement were reviewed in detail with the patient. Please refer to After Visit Summary for other counseling recommendations.   Return in about 1 week (around 06/13/2022) for OFFICE OB VISIT (MD or APP).  Future Appointments  Date Time Provider Department Center  06/13/2022  4:10 PM Gerrit Heck,  CNM [facility] None  06/20/2022  4:10 PM Gerrit Heck, CNM [facility] None  06/27/2022  4:10 PM Magnus Sinning Dimas [place], PA-C [facility] None    Jaynie Collins, MD

## 2022-06-06 NOTE — Progress Notes (Signed)
Pt presents for ROB. +FM, denies VB, LOF, ctx.

## 2022-06-06 NOTE — Patient Instructions (Signed)
Return to office for any scheduled appointments. Call the office or go to the MAU at [facility] if: You begin to have strong, frequent contractions Your water breaks.  Sometimes it is a big gush of fluid, sometimes it is just a trickle that keeps getting your underwear wet or running down your legs You have vaginal bleeding.  It is normal to have a small amount of spotting if your cervix was checked.  You do not feel your baby moving like normal.  If you do not, get something to eat and drink and lay down and focus on feeling your baby move.   If your baby is still not moving like normal, you should call the office or go to MAU. Any other obstetric concerns.   Tubal Ligation with cesarean section or after vaginal delivery while in hospital  With cesarean section: $1170 After vaginal delivery: $1280 + anesthesia (you have to call them to ask what the cost is)  Patient needs to inform staff of desire for tubal ligation as early as possible in the pregnancy  Pre-payment is required for procedure to be scheduled  Payment plans are available upon request   ** Patient will need to call [facility] (Anesthesia) at [phone] for estimate**   Outpatient tubal ligation  Estimated out-of-pocket total for patient (does NOT include anesthesia)  Procedure fee + Facility fee  Salpingectomy: $855 + $5835.90 = $6690.90 [place]: $497.80 + $5835.90 = $6333.70 Filshie Clips: $497.80 + $5835.90 = $8295.62  Patient needs to inform staff of desire for tubal ligation Pre-payment of procedure fee is required for procedure to be scheduled  Payment plans are available upon request  Need to apply for Financial Aid to reduce your cost, ask [place] how!   ** Patient will need to call [facility] (Anesthesia) at [phone] for estimate

## 2022-06-10 LAB — CERVICOVAGINAL ANCILLARY ONLY
Chlamydia: NEGATIVE
Comment: NEGATIVE
Comment: NORMAL
Neisseria Gonorrhea: NEGATIVE

## 2022-06-10 LAB — CULTURE, BETA STREP (GROUP B ONLY): Strep Gp B Culture: NEGATIVE

## 2022-06-13 ENCOUNTER — Ambulatory Visit (INDEPENDENT_AMBULATORY_CARE_PROVIDER_SITE_OTHER): Payer: Self-pay

## 2022-06-13 VITALS — BP 120/81 | HR 75 | Wt 213.6 lb

## 2022-06-13 DIAGNOSIS — Z3480 Encounter for supervision of other normal pregnancy, unspecified trimester: Secondary | ICD-10-CM

## 2022-06-13 DIAGNOSIS — O26843 Uterine size-date discrepancy, third trimester: Secondary | ICD-10-CM

## 2022-06-13 DIAGNOSIS — Z3A37 37 weeks gestation of pregnancy: Secondary | ICD-10-CM

## 2022-06-13 NOTE — Progress Notes (Signed)
   LOW-RISK PREGNANCY OFFICE VISIT  Patient name: Susan Anthony MRN [id number]  Date of birth: February 26, 1995 Chief Complaint:   Routine Prenatal Visit  Subjective:   Susan Anthony is a 27 y.o. G2P1001 female at [redacted]w[redacted]d with an Estimated Date of Delivery: 06/28/22 being seen today for ongoing management of a low-risk pregnancy aeb has Supervision of other normal pregnancy, antepartum; Benign gestational thrombocytopenia in third trimester [facility]); and Anemia affecting pregnancy in third trimester on their problem list.  Patient presents today, alone, with occasional contractions.  Patient endorses fetal movement.  Patient denies vaginal concerns including abnormal discharge, leaking of fluid, and bleeding. No issues with urination, constipation, or diarrhea.    Contractions: Irregular. Vag. Bleeding: None.  Movement: Present.  Reviewed past medical,surgical, social, obstetrical and family history as well as problem list, medications and allergies.  Objective   Vitals:   06/13/22 1620  BP: 120/81  Pulse: 75  Weight: 213 lb 9.6 oz (96.9 kg)  Body mass index is 39.07 kg/m.  Total Weight Gain:23 lb 9.6 oz (10.7 kg)         Physical Examination:   General appearance: Well appearing, and in no distress  Mental status: Alert, oriented to person, place, and time  Skin: Warm & dry  Cardiovascular: Normal heart rate noted  Respiratory: Normal respiratory effort, no distress  Abdomen: Soft, gravid, nontender, LGA with Fundal Height: 42 cm  Pelvic: Cervical exam deferred           Extremities: Edema: Trace  Fetal Status: Fetal Heart Rate (bpm): 139  Movement: Present   No results found for this or any previous visit (from the past 24 hour(s)).  Assessment & Plan:  Low-risk pregnancy of a 27 y.o., G2P1001 at [redacted]w[redacted]d with an Estimated Date of Delivery: 06/28/22   1. Supervision of other normal pregnancy, antepartum -Anticipatory guidance for upcoming appts. -Patient  to schedule next appt in 1 weeks for an in-person visit.  2. [redacted] weeks gestation of pregnancy -Reviewed contractions/cramping and when to report to MAU for evaluation. -Doing well otherwise.   3. Uterine size date discrepancy pregnancy, third trimester -42cm today. -Patient previously scheduled for [facility], but states when she went to address given it was not ultrasound facility. -Admin staff to look into this and follow up with patient.      Meds: No orders of the defined types were placed in this encounter.  Labs/procedures today:  Lab Orders  No laboratory test(s) ordered today     Reviewed: Term labor symptoms and general obstetric precautions including but not limited to vaginal bleeding, contractions, leaking of fluid and fetal movement were reviewed in detail with the patient.  All questions were answered.  Follow-up: No follow-ups on file.  No orders of the defined types were placed in this encounter.  Cherre Robins MSN, CNM 06/13/2022

## 2022-06-13 NOTE — Progress Notes (Signed)
Pt presents for ROB visit. Pt was unable to complete US due to wrong address given.

## 2022-06-20 ENCOUNTER — Ambulatory Visit (INDEPENDENT_AMBULATORY_CARE_PROVIDER_SITE_OTHER): Payer: Self-pay

## 2022-06-20 VITALS — BP 121/84 | HR 89 | Wt 215.8 lb

## 2022-06-20 DIAGNOSIS — O26843 Uterine size-date discrepancy, third trimester: Secondary | ICD-10-CM

## 2022-06-20 DIAGNOSIS — O368130 Decreased fetal movements, third trimester, not applicable or unspecified: Secondary | ICD-10-CM

## 2022-06-20 DIAGNOSIS — Z3A38 38 weeks gestation of pregnancy: Secondary | ICD-10-CM

## 2022-06-20 DIAGNOSIS — Z3480 Encounter for supervision of other normal pregnancy, unspecified trimester: Secondary | ICD-10-CM

## 2022-06-20 NOTE — Progress Notes (Signed)
   LOW-RISK PREGNANCY OFFICE VISIT  Patient name: Susan Anthony MRN [id number]  Date of birth: May 30, 1995 Chief Complaint:   Routine Prenatal Visit  Subjective:   Susan Anthony is a 27 y.o. G2P1001 female at [redacted]w[redacted]d with an Estimated Date of Delivery: 06/28/22 being seen today for ongoing management of a low-risk pregnancy aeb has Supervision of other normal pregnancy, antepartum; Benign gestational thrombocytopenia in third trimester [facility]); and Anemia affecting pregnancy in third trimester on their problem list.  Patient presents today, alone, with  decreased fetal movement .  Patient states fetal movement has been consistently low since last visit. Patient reports intermittent contractions.  Patient denies vaginal concerns including abnormal discharge, leaking of fluid, and bleeding. No issues with urination, constipation, or diarrhea.    Contractions: Irritability. Vag. Bleeding: None.  Movement: (!) Decreased.  Reviewed past medical,surgical, social, obstetrical and family history as well as problem list, medications and allergies.  Objective   Vitals:   06/20/22 1610  BP: 121/84  Pulse: 89  Weight: 215 lb 12.8 oz (97.9 kg)  Body mass index is 39.47 kg/m.  Total Weight Gain:25 lb 12.8 oz (11.7 kg)         Physical Examination:   General appearance: Well appearing, and in no distress  Mental status: Alert, oriented to person, place, and time  Skin: Warm & dry  Cardiovascular: Normal heart rate noted  Respiratory: Normal respiratory effort, no distress  Abdomen: Soft, gravid, nontender, LGA with Fundal Height: 45 cm  Pelvic: Cervical exam performed  Dilation: 1.5 Effacement (%): 70 Station: -3 Presentation: Vertex  Extremities: Edema: Trace  Fetal Status: Fetal Heart Rate (bpm): 153  Movement: (!) Decreased   No results found for this or any previous visit (from the past 24 hour(s)).  Assessment & Plan:  Low-risk pregnancy of a 27 y.o., G2P1001  at [redacted]w[redacted]d with an Estimated Date of Delivery: 06/28/22   1. Supervision of other normal pregnancy, antepartum -Anticipatory guidance for upcoming appts. -Patient to schedule next appt in 5-6 weeks for a Postpartum visit.  2. [redacted] weeks gestation of pregnancy -Doing well with exceptions of DFM.  3. Uterine size date discrepancy pregnancy, third trimester -45cm. -[place] on 5/17 show infant weight at 44%ile. No polyhydramnios noted.   4. Decreased fetal movements in third trimester, single or unspecified fetus -NST Reactive. -Discussed IOL and patient agreeable.  -Dr. Katharine Look consulted and informed of patient status, evaluation, interventions, and results. Advised: *Agree with IOL at 39 weeks. -L&D contacted and patient scheduled for 5/25 at [facility]. -Orders completed. -Patient updated on POC and agreeable. No questions.        Meds: No orders of the defined types were placed in this encounter.  Labs/procedures today:  Lab Orders  No laboratory test(s) ordered today     Reviewed: Term labor symptoms and general obstetric precautions including but not limited to vaginal bleeding, contractions, leaking of fluid and fetal movement were reviewed in detail with the patient.  All questions were answered.  Follow-up: No follow-ups on file.  No orders of the defined types were placed in this encounter.  Cherre Robins MSN, CNM 06/20/2022

## 2022-06-20 NOTE — Progress Notes (Signed)
Pt presents for ROB visit. Pt reports decreased fetal movement for 5 days. Requesting cervical check   [facility] US done 06/14/22

## 2022-06-22 ENCOUNTER — Inpatient Hospital Stay (HOSPITAL_COMMUNITY): Payer: Medicaid Other | Admitting: Anesthesiology

## 2022-06-22 ENCOUNTER — Inpatient Hospital Stay (HOSPITAL_COMMUNITY)
Admission: RE | Admit: 2022-06-22 | Discharge: 2022-06-23 | DRG: 807 | Disposition: A | Discharge status: Home / Self Care | Payer: Medicaid Other | Attending: Family Medicine | Admitting: Family Medicine

## 2022-06-22 ENCOUNTER — Inpatient Hospital Stay (HOSPITAL_COMMUNITY): Payer: Medicaid Other

## 2022-06-22 ENCOUNTER — Encounter (HOSPITAL_COMMUNITY): Payer: Self-pay | Admitting: Obstetrics & Gynecology

## 2022-06-22 ENCOUNTER — Other Ambulatory Visit: Payer: Self-pay

## 2022-06-22 DIAGNOSIS — Z3A39 39 weeks gestation of pregnancy: Secondary | ICD-10-CM

## 2022-06-22 DIAGNOSIS — O368130 Decreased fetal movements, third trimester, not applicable or unspecified: Principal | ICD-10-CM | POA: Diagnosis present

## 2022-06-22 DIAGNOSIS — Z3A38 38 weeks gestation of pregnancy: Secondary | ICD-10-CM

## 2022-06-22 DIAGNOSIS — O9902 Anemia complicating childbirth: Secondary | ICD-10-CM | POA: Diagnosis present

## 2022-06-22 DIAGNOSIS — O99215 Obesity complicating the puerperium: Secondary | ICD-10-CM | POA: Diagnosis present

## 2022-06-22 DIAGNOSIS — O368190 Decreased fetal movements, unspecified trimester, not applicable or unspecified: Secondary | ICD-10-CM | POA: Diagnosis present

## 2022-06-22 DIAGNOSIS — Z3480 Encounter for supervision of other normal pregnancy, unspecified trimester: Secondary | ICD-10-CM

## 2022-06-22 LAB — CBC
HCT: 34 % — ABNORMAL LOW (ref 36.0–46.0)
HCT: 34.4 % — ABNORMAL LOW (ref 36.0–46.0)
Hemoglobin: 11.2 g/dL — ABNORMAL LOW (ref 12.0–15.0)
Hemoglobin: 11.3 g/dL — ABNORMAL LOW (ref 12.0–15.0)
MCH: 29 pg (ref 26.0–34.0)
MCH: 29.7 pg (ref 26.0–34.0)
MCHC: 32.9 g/dL (ref 30.0–36.0)
MCHC: 32.8 g/dL (ref 30.0–36.0)
MCV: 88.1 fL (ref 80.0–100.0)
MCV: 90.5 fL (ref 80.0–100.0)
Platelets: 143 10*3/uL — ABNORMAL LOW (ref 150–400)
Platelets: 126 10*3/uL — ABNORMAL LOW (ref 150–400)
RBC: 3.86 MIL/uL — ABNORMAL LOW (ref 3.87–5.11)
RBC: 3.8 MIL/uL — ABNORMAL LOW (ref 3.87–5.11)
RDW: 14.1 % (ref 11.5–15.5)
RDW: 14.3 % (ref 11.5–15.5)
WBC: 8.7 10*3/uL (ref 4.0–10.5)
WBC: 9.6 10*3/uL (ref 4.0–10.5)
nRBC: 0 % (ref 0.0–0.2)
nRBC: 0 % (ref 0.0–0.2)

## 2022-06-22 LAB — TYPE AND SCREEN
ABO/RH(D): O POS
Antibody Screen: NEGATIVE

## 2022-06-22 LAB — RPR: RPR Ser Ql: NONREACTIVE

## 2022-06-22 LAB — CBC WITH DIFFERENTIAL/PLATELET
Abs Immature Granulocytes: 0.05 10*3/uL (ref 0.00–0.07)
Basophils Absolute: 0 10*3/uL (ref 0.0–0.1)
Basophils Relative: 0 %
Eosinophils Absolute: 0 10*3/uL (ref 0.0–0.5)
Eosinophils Relative: 0 %
HCT: 32.3 % — ABNORMAL LOW (ref 36.0–46.0)
Hemoglobin: 10.6 g/dL — ABNORMAL LOW (ref 12.0–15.0)
Immature Granulocytes: 0 %
Lymphocytes Relative: 7 %
Lymphs Abs: 0.9 10*3/uL (ref 0.7–4.0)
MCH: 29.4 pg (ref 26.0–34.0)
MCHC: 32.8 g/dL (ref 30.0–36.0)
MCV: 89.7 fL (ref 80.0–100.0)
Monocytes Absolute: 0.7 10*3/uL (ref 0.1–1.0)
Monocytes Relative: 5 %
Neutro Abs: 11 10*3/uL — ABNORMAL HIGH (ref 1.7–7.7)
Neutrophils Relative %: 88 %
Platelets: 120 10*3/uL — ABNORMAL LOW (ref 150–400)
RBC: 3.6 MIL/uL — ABNORMAL LOW (ref 3.87–5.11)
RDW: 14.1 % (ref 11.5–15.5)
WBC: 12.6 10*3/uL — ABNORMAL HIGH (ref 4.0–10.5)
nRBC: 0 % (ref 0.0–0.2)

## 2022-06-22 MED ORDER — FENTANYL CITRATE (PF) 100 MCG/2ML IJ SOLN
50.0000 ug | INTRAMUSCULAR | Status: DC | PRN
  Administered 2022-06-22: 50 ug via INTRAVENOUS
  Filled 2022-06-22: qty 2

## 2022-06-22 MED ORDER — OXYTOCIN-SODIUM CHLORIDE 30-0.9 UT/500ML-% IV SOLN
2.5000 [IU]/h | INTRAVENOUS | Status: DC
  Filled 2022-06-22: qty 500

## 2022-06-22 MED ORDER — ONDANSETRON HCL 4 MG/2ML IJ SOLN: 4.0000 mg | INTRAMUSCULAR | Status: DC | PRN

## 2022-06-22 MED ORDER — ONDANSETRON HCL 4 MG PO TABS: 4.0000 mg | ORAL_TABLET | ORAL | Status: DC | PRN

## 2022-06-22 MED ORDER — LACTATED RINGERS IV SOLN
500.0000 mL | INTRAVENOUS | Status: DC | PRN
  Administered 2022-06-22: 500 mL via INTRAVENOUS

## 2022-06-22 MED ORDER — DIPHENHYDRAMINE HCL 25 MG PO CAPS: 25.0000 mg | ORAL_CAPSULE | Freq: Four times a day (QID) | ORAL | Status: DC | PRN

## 2022-06-22 MED ORDER — LIDOCAINE HCL (PF) 1 % IJ SOLN: 30.0000 mL | INTRAMUSCULAR | Status: DC | PRN

## 2022-06-22 MED ORDER — ACETAMINOPHEN 325 MG PO TABS
650.0000 mg | ORAL_TABLET | ORAL | Status: DC | PRN
  Administered 2022-06-22 – 2022-06-23 (×2): 650 mg via ORAL
  Filled 2022-06-22 (×2): qty 2

## 2022-06-22 MED ORDER — OXYTOCIN-SODIUM CHLORIDE 30-0.9 UT/500ML-% IV SOLN
1.0000 m[IU]/min | INTRAVENOUS | Status: DC
  Administered 2022-06-22: 2 m[IU]/min via INTRAVENOUS

## 2022-06-22 MED ORDER — EPHEDRINE 5 MG/ML INJ: 10.0000 mg | INTRAVENOUS | Status: DC | PRN

## 2022-06-22 MED ORDER — METHYLERGONOVINE MALEATE 0.2 MG/ML IJ SOLN: 0.2000 mg | INTRAMUSCULAR | Status: DC | PRN

## 2022-06-22 MED ORDER — TETANUS-DIPHTH-ACELL PERTUSSIS 5-2.5-18.5 LF-MCG/0.5 IM SUSY: 0.5000 mL | PREFILLED_SYRINGE | Freq: Once | INTRAMUSCULAR | Status: DC

## 2022-06-22 MED ORDER — LACTATED RINGERS IV SOLN: INTRAVENOUS | Status: DC

## 2022-06-22 MED ORDER — FERROUS SULFATE 325 (65 FE) MG PO TABS
325.0000 mg | ORAL_TABLET | ORAL | Status: DC
  Administered 2022-06-22: 325 mg via ORAL
  Filled 2022-06-22: qty 1

## 2022-06-22 MED ORDER — LACTATED RINGERS IV SOLN
500.0000 mL | Freq: Once | INTRAVENOUS | Status: AC
  Administered 2022-06-22: 500 mL via INTRAVENOUS

## 2022-06-22 MED ORDER — IBUPROFEN 600 MG PO TABS
600.0000 mg | ORAL_TABLET | Freq: Four times a day (QID) | ORAL | Status: DC
  Administered 2022-06-22 – 2022-06-23 (×5): 600 mg via ORAL
  Filled 2022-06-22 (×5): qty 1

## 2022-06-22 MED ORDER — MISOPROSTOL 25 MCG QUARTER TABLET
25.0000 ug | ORAL_TABLET | Freq: Once | ORAL | Status: AC
  Administered 2022-06-22: 25 ug via ORAL
  Filled 2022-06-22: qty 1

## 2022-06-22 MED ORDER — TRANEXAMIC ACID-NACL 1000-0.7 MG/100ML-% IV SOLN
1000.0000 mg | Freq: Once | INTRAVENOUS | Status: AC | PRN
  Administered 2022-06-22: 1000 mg via INTRAVENOUS

## 2022-06-22 MED ORDER — DIPHENHYDRAMINE HCL 50 MG/ML IJ SOLN: 12.5000 mg | INTRAMUSCULAR | Status: DC | PRN

## 2022-06-22 MED ORDER — METHYLERGONOVINE MALEATE 0.2 MG PO TABS: 0.2000 mg | ORAL_TABLET | ORAL | Status: DC | PRN

## 2022-06-22 MED ORDER — SENNOSIDES-DOCUSATE SODIUM 8.6-50 MG PO TABS
2.0000 | ORAL_TABLET | ORAL | Status: DC
  Filled 2022-06-22: qty 2

## 2022-06-22 MED ORDER — ACETAMINOPHEN 325 MG PO TABS: 650.0000 mg | ORAL_TABLET | ORAL | Status: DC | PRN

## 2022-06-22 MED ORDER — PHENYLEPHRINE 80 MCG/ML (10ML) SYRINGE FOR IV PUSH (FOR BLOOD PRESSURE SUPPORT)
80.0000 ug | PREFILLED_SYRINGE | INTRAVENOUS | Status: DC | PRN
  Filled 2022-06-22: qty 10

## 2022-06-22 MED ORDER — BENZOCAINE-MENTHOL 20-0.5 % EX AERO
1.0000 | INHALATION_SPRAY | CUTANEOUS | Status: DC | PRN
  Administered 2022-06-22: 1 via TOPICAL
  Filled 2022-06-22: qty 56

## 2022-06-22 MED ORDER — TRANEXAMIC ACID-NACL 1000-0.7 MG/100ML-% IV SOLN
INTRAVENOUS | Status: AC
  Filled 2022-06-22: qty 100

## 2022-06-22 MED ORDER — TERBUTALINE SULFATE 1 MG/ML IJ SOLN: 0.2500 mg | Freq: Once | INTRAMUSCULAR | Status: DC | PRN

## 2022-06-22 MED ORDER — SIMETHICONE 80 MG PO CHEW: 80.0000 mg | CHEWABLE_TABLET | ORAL | Status: DC | PRN

## 2022-06-22 MED ORDER — OXYTOCIN-SODIUM CHLORIDE 30-0.9 UT/500ML-% IV SOLN: 1.0000 m[IU]/min | INTRAVENOUS | Status: DC

## 2022-06-22 MED ORDER — WITCH HAZEL-GLYCERIN EX PADS: 1.0000 | MEDICATED_PAD | CUTANEOUS | Status: DC | PRN

## 2022-06-22 MED ORDER — BISACODYL 10 MG RE SUPP: 10.0000 mg | Freq: Every day | RECTAL | Status: DC | PRN

## 2022-06-22 MED ORDER — ONDANSETRON HCL 4 MG/2ML IJ SOLN
4.0000 mg | Freq: Four times a day (QID) | INTRAMUSCULAR | Status: DC | PRN
  Administered 2022-06-22: 4 mg via INTRAVENOUS
  Filled 2022-06-22: qty 2

## 2022-06-22 MED ORDER — COCONUT OIL OIL: 1.0000 | TOPICAL_OIL | Status: DC | PRN

## 2022-06-22 MED ORDER — OXYTOCIN BOLUS FROM INFUSION
333.0000 mL | Freq: Once | INTRAVENOUS | Status: AC
  Administered 2022-06-22: 333 mL via INTRAVENOUS

## 2022-06-22 MED ORDER — FENTANYL-BUPIVACAINE-NACL 0.5-0.125-0.9 MG/250ML-% EP SOLN
12.0000 mL/h | EPIDURAL | Status: DC | PRN
  Administered 2022-06-22: 12 mL/h via EPIDURAL
  Filled 2022-06-22: qty 250

## 2022-06-22 MED ORDER — MEASLES, MUMPS & RUBELLA VAC IJ SOLR: 0.5000 mL | Freq: Once | INTRAMUSCULAR | Status: DC

## 2022-06-22 MED ORDER — PRENATAL MULTIVITAMIN CH: 1.0000 | ORAL_TABLET | Freq: Every day | ORAL | Status: DC

## 2022-06-22 MED ORDER — SOD CITRATE-CITRIC ACID 500-334 MG/5ML PO SOLN: 30.0000 mL | ORAL | Status: DC | PRN

## 2022-06-22 MED ORDER — DIBUCAINE (PERIANAL) 1 % EX OINT: 1.0000 | TOPICAL_OINTMENT | CUTANEOUS | Status: DC | PRN

## 2022-06-22 MED ORDER — PHENYLEPHRINE 80 MCG/ML (10ML) SYRINGE FOR IV PUSH (FOR BLOOD PRESSURE SUPPORT): 80.0000 ug | PREFILLED_SYRINGE | INTRAVENOUS | Status: DC | PRN

## 2022-06-22 MED ORDER — FLEET ENEMA 7-19 GM/118ML RE ENEM: 1.0000 | ENEMA | Freq: Every day | RECTAL | Status: DC | PRN

## 2022-06-22 MED ORDER — PRENATAL MULTIVITAMIN CH
1.0000 | ORAL_TABLET | Freq: Every day | ORAL | Status: DC
  Administered 2022-06-23: 1 via ORAL
  Filled 2022-06-22: qty 1

## 2022-06-22 MED ORDER — MISOPROSTOL 25 MCG QUARTER TABLET
25.0000 ug | ORAL_TABLET | Freq: Once | ORAL | Status: DC
  Filled 2022-06-22: qty 1

## 2022-06-22 MED ORDER — MEDROXYPROGESTERONE ACETATE 150 MG/ML IM SUSP: 150.0000 mg | INTRAMUSCULAR | Status: DC | PRN

## 2022-06-22 MED ORDER — LIDOCAINE-EPINEPHRINE (PF) 1.5 %-1:200000 IJ SOLN
INTRAMUSCULAR | Status: DC | PRN
  Administered 2022-06-22: 2 mL via PERINEURAL
  Administered 2022-06-22: 3 mL via PERINEURAL

## 2022-06-22 NOTE — Anesthesia Procedure Notes (Signed)
Epidural Patient location during procedure: OB Start time: 06/22/2022 7:13 AM End time: 06/22/2022 7:34 AM  Staffing Anesthesiologist: Lewie Loron, MD Performed: anesthesiologist   Preanesthetic Checklist Completed: patient identified, IV checked, risks and benefits discussed, monitors and equipment checked, pre-op evaluation and timeout performed  Epidural Patient position: sitting Prep: DuraPrep and site prepped and draped Patient monitoring: heart rate, continuous pulse ox and blood pressure Approach: midline Location: L3-L4 Injection technique: LOR air and LOR saline  Needle:  Needle type: Tuohy  Needle gauge: 17 G Needle length: 9 cm Needle insertion depth: 9 cm Catheter type: closed end flexible Catheter size: 19 Gauge Catheter at skin depth: 15 cm Test dose: negative  Assessment Sensory level: T8 Events: blood not aspirated, no cerebrospinal fluid, injection not painful, no injection resistance, no paresthesia and negative IV test  Additional Notes Reason for block:procedure for pain

## 2022-06-22 NOTE — H&P (Signed)
OBSTETRIC ADMISSION HISTORY AND PHYSICAL  Susan Anthony is a 27 y.o. female G2P1001 with IUP at [redacted]w[redacted]d by LMP presenting for IOL in the setting of decreased fetal movement at term. She reports movement has been decreased for a few days. She is having irregular, mild contractions. No leaking fluid or vaginal bleeding. No headaches, vision changes, RUQ/epigastric pain. She plans on breast/bottle feeding. She request interval BTL for birth control. She received her prenatal care at  [facility]    Dating: By LMP --->  Estimated Date of Delivery: 06/28/22  Sono:    @[redacted]w[redacted]d , CWD, cephalic presentation, anterior placenta, 3168g, 44% EFW  Prenatal History/Complications:  - Anemia - Obesity - Uterine size discrepancy - Chlamydia in pregnancy  Past Medical History: Past Medical History:  Diagnosis Date   Chlamydia infection 2019    Past Surgical History: Past Surgical History:  Procedure Laterality Date   NO PAST SURGERIES      Obstetrical History: OB History     Gravida  2   Para  1   Term  1   Preterm  0   AB  0   Living  1      SAB  0   IAB  0   Ectopic  0   Multiple  0   Live Births  1           Social History Social History   Socioeconomic History   Marital status: Single    Spouse name: Not on file   Number of children: Not on file   Years of education: Not on file   Highest education level: Not on file  Occupational History   Not on file  Tobacco Use   Smoking status: Never   Smokeless tobacco: Never  Vaping Use   Vaping Use: Never used  Substance and Sexual Activity   Alcohol use: Not Currently   Drug use: Never   Sexual activity: Yes  Other Topics Concern   Not on file  Social History Narrative   ** Merged History Encounter **       Social Determinants of Health   Financial Resource Strain: Not on file  Food Insecurity: No Food Insecurity (06/22/2022)   Hunger Vital Sign    Worried About Running Out of Food in the  Last Year: Never true    Ran Out of Food in the Last Year: Never true  Transportation Needs: No Transportation Needs (06/22/2022)   PRAPARE - Administrator, Civil Service (Medical): No    Lack of Transportation (Non-Medical): No  Physical Activity: Not on file  Stress: Not on file  Social Connections: Not on file    Family History: Family History  Problem Relation Age of Onset   Diabetes Maternal Grandmother    Hypertension Maternal Grandmother    Hyperlipidemia Maternal Grandmother    Diabetes Paternal Grandmother    Hypertension Paternal Grandmother    Hyperlipidemia Paternal Grandmother     Allergies: No Known Allergies  Medications Prior to Admission  Medication Sig Dispense Refill Last Dose   Biotin 1 MG CAPS Take by mouth.      ferrous sulfate 325 (65 FE) MG EC tablet Take 1 tablet (325 mg total) by mouth every other day. 45 tablet 1    Butalbital-APAP-Caffeine 50-325-40 MG capsule Take 1-2 capsules by mouth every 6 (six) hours as needed for headache. 30 capsule 0    prenatal vitamin w/FE, FA (PRENATAL 1 + 1) 27-1 MG TABS tablet  Take 1 tablet by mouth daily at 12 noon.      senna-docusate (SENOKOT-S) 8.6-50 MG tablet Take 2 tablets by mouth at bedtime as needed for mild constipation. (Patient not taking: Reported on 11/30/2021) 10 tablet 0    Review of Systems   All systems reviewed and negative except as stated in HPI  Blood pressure 126/78, pulse 84, temperature 98.1 F (36.7 C), temperature source Oral, resp. rate 16, height 5\' 1"  (1.549 m), weight 97.7 kg, last menstrual period 09/21/2021, SpO2 99 %, currently breastfeeding. General appearance: alert and no distress Lungs: effort normal Heart: regular rate Abdomen: soft, non-tender; gravid Extremities: no sign of DVT Presentation: cephalic per RN exam Fetal monitoring: 135 bpm, moderate variability, +15x15 accels, no decels Uterine activity: Q4-7 mins Dilation: 2 Effacement (%): 70 Station:  -3 Exam by:: Orvan Seen, RN   Prenatal labs: ABO, Rh: --/--/O POS (05/25 0028) Antibody: NEG (05/25 0028) Rubella: 2.75 (11/03 1002) RPR: Non Reactive (03/14 1020)  HBsAg: Negative (11/03 1002)  HIV: Non Reactive (03/14 1020)  GBS: Negative/-- (05/09 1631)  2 hr Glucola: 76/138/78 Genetic screening: LR NIPS, AFP negative Anatomy US: patient did not have anatomy US  Prenatal Transfer Tool  Maternal Diabetes: No Genetic Screening: Normal Maternal Ultrasounds/Referrals: Normal Fetal Ultrasounds or other Referrals:  None Maternal Substance Abuse:  No Significant Maternal Medications:  None Significant Maternal Lab Results: Group B Strep negative  Results for orders placed or performed during the hospital encounter of 06/22/22 (from the past 24 hour(s))  CBC   Collection Time: 06/22/22 12:28 AM  Result Value Ref Range   WBC 8.7 4.0 - 10.5 K/uL   RBC 3.86 (L) 3.87 - 5.11 MIL/uL   Hemoglobin 11.2 (L) 12.0 - 15.0 g/dL   HCT 14.7 (L) 82.9 - 56.2 %   MCV 88.1 80.0 - 100.0 fL   MCH 29.0 26.0 - 34.0 pg   MCHC 32.9 30.0 - 36.0 g/dL   RDW 13.0 86.5 - 78.4 %   Platelets 143 (L) 150 - 400 K/uL   nRBC 0.0 0.0 - 0.2 %  Type and screen   Collection Time: 06/22/22 12:28 AM  Result Value Ref Range   ABO/RH(D) O POS    Antibody Screen NEG    Sample Expiration      06/25/2022,2359 Performed at [facility], [address] [place], [place] [address]     Patient Active Problem List   Diagnosis Date Noted   Decreased fetal movement 06/22/2022   Benign gestational thrombocytopenia in third trimester (HCC) 05/13/2022   Anemia affecting pregnancy in third trimester 05/13/2022   Supervision of other normal pregnancy, antepartum 11/29/2021    Assessment/Plan:  Susan Anthony is a 27 y.o. G2P1001 at [redacted]w[redacted]d here for IOL in the setting of decreased fetal movement at term  #Labor: Patient given 1 dose oral Cytotec. Pitocin and AROM when appropriate #Pain: PRN per  patient request #FWB: Cat 1 #ID: GBS neg #MOF: Breast/bottle #MOC: Interval BTL #Circ: n/a  Brand Males, CNM  06/22/2022, 2:08 AM

## 2022-06-22 NOTE — Progress Notes (Signed)
Subjective: Feeling pressure  Objective: BP 129/87   Pulse (!) 114   Temp 98 F (36.7 C) (Oral)   Resp 16   Ht 5\' 1"  (1.549 m)   Wt 97.7 kg   LMP 09/21/2021   SpO2 100%   BMI 40.70 kg/m  No intake/output data recorded. No intake/output data recorded.  FHT:  FHR: 150 bpm, variability: moderate,  accelerations:  Present,  decelerations:  Absent UC:   regular, every 2-3 minutes SVE:   Dilation: 8.5 Effacement (%): 90 Station: -1 Exam by:: Susan Daleo, DO  Labs: Lab Results  Component Value Date   WBC 9.6 06/22/2022   HGB 11.3 (L) 06/22/2022   HCT 34.4 (L) 06/22/2022   MCV 90.5 06/22/2022   PLT 126 (L) 06/22/2022    Assessment / Plan: Induction of labor due to DFM,  progressing well on pitocin  Anticipate vaginal delivery soon  Susan Heritage, DO 06/22/2022, 11:03 AM

## 2022-06-22 NOTE — Progress Notes (Signed)
Patient ID: Susan Anthony, female   DOB: 25-Apr-1995, 27 y.o.   MRN: [id number]  Patient complete at 1136. Pushed for about 30 minutes without much progress - will labor down some. FHT cat 1 during pushing.  Levie Heritage, DO

## 2022-06-22 NOTE — Discharge Summary (Signed)
Postpartum Discharge Summary  Date of Service updated***     Patient Name: Susan Anthony DOB: [birth year] MRN: [id number]  Date of admission: 06/22/2022 Delivery date:06/22/2022  Delivering provider: Jacklyn Shell  Date of discharge: 06/22/2022  Admitting diagnosis: Decreased fetal movement [O36.8190] Intrauterine pregnancy: [redacted]w[redacted]d     Secondary diagnosis:  Principal Problem:   Decreased fetal movement Active Problems:   Vaginal delivery  Additional problems: ***    Discharge diagnosis: Term Pregnancy Delivered                                              Post partum procedures:{Postpartum procedures:23558} Augmentation: AROM and Cytotec Complications: None  Hospital course: Induction of Labor With Vaginal Delivery   27 y.o. yo G2P1001 at [redacted]w[redacted]d was admitted to the hospital 06/22/2022 for induction of labor.  Indication for induction:  decreased fetal movement at term .  Patient had an labor course complicated by nothing Membrane Rupture Time/Date: 5:37 AM ,06/22/2022   Delivery Method:Vaginal, Spontaneous  Episiotomy: None  Lacerations:  1st degree;Labial;Vaginal  Details of delivery can be found in separate delivery note.  Patient had a postpartum course complicated by***. Patient is discharged home 06/22/22.  Newborn Data: Birth date:06/22/2022  Birth time:2:46 PM  Gender:Female  Living status:Living  Apgars:9 ,9  Weight:   Magnesium Sulfate received: No BMZ received: No Rhophylac:N/A MMR:N/A T-DaP:{Tdap:23962} Flu: {WUJ:81191} Transfusion:{Transfusion received:30440034}  Physical exam  Vitals:   06/22/22 1410 06/22/22 1415 06/22/22 1420 06/22/22 1439  BP: 130/88 122/77 137/89 (!) 147/72  Pulse: 96 (!) 106 97 (!) 105  Resp:      Temp:      TempSrc:      SpO2:      Weight:      Height:       General: {Exam; general:21111117} Lochia: {Desc; appropriate/inappropriate:30686::"appropriate"} Uterine Fundus: {Desc;  firm/soft:30687} Incision: {Exam; incision:21111123} DVT Evaluation: {Exam; dvt:2111122} Labs: Lab Results  Component Value Date   WBC 9.6 06/22/2022   HGB 11.3 (L) 06/22/2022   HCT 34.4 (L) 06/22/2022   MCV 90.5 06/22/2022   PLT 126 (L) 06/22/2022      Latest Ref Rng & Units 05/09/2022    4:49 PM  CMP  Glucose 70 - 99 mg/dL 84   BUN 6 - 20 mg/dL 5   Creatinine 4.78 - 2.95 mg/dL 6.21   Sodium 308 - 657 mmol/L 138   Potassium 3.5 - 5.2 mmol/L 3.8   Chloride 96 - 106 mmol/L 106   CO2 20 - 29 mmol/L 20   Calcium 8.7 - 10.2 mg/dL 9.1   Total Protein 6.0 - 8.5 g/dL 6.0   Total Bilirubin 0.0 - 1.2 mg/dL <8.4   Alkaline Phos 44 - 121 IU/L 172   AST 0 - 40 IU/L 18   ALT 0 - 32 IU/L 20    Edinburgh Score:    04/23/2018    2:32 PM  Edinburgh Postnatal Depression Scale Screening Tool  I have been able to laugh and see the funny side of things. 0  I have looked forward with enjoyment to things. 0  I have blamed myself unnecessarily when things went wrong. 1  I have been anxious or worried for no good reason. 0  I have felt scared or panicky for no good reason. 0  Things have been getting on top of me. 0  I have been so unhappy that I have had difficulty sleeping. 0  I have felt sad or miserable. 0  I have been so unhappy that I have been crying. 1  The thought of harming myself has occurred to me. 0  Edinburgh Postnatal Depression Scale Total 2     After visit meds:  Allergies as of 06/22/2022   No Known Allergies   Med Rec must be completed prior to using this [facility]***        Discharge home in stable condition Infant Feeding: {Baby feeding:23562} Infant Disposition:{CHL IP OB HOME WITH WUJWJX:91478} Discharge instruction: per After Visit Summary and Postpartum booklet. Activity: Advance as tolerated. Pelvic rest for 6 weeks.  Diet: {OB GNFA:21308657} Future Appointments:No future appointments. Follow up Visit:   Please schedule this patient for a In person  postpartum visit in 4 weeks with the following provider: Any provider. Additional Postpartum F/U:  Low risk pregnancy complicated by:  Delivery mode:  Vaginal, Spontaneous  Anticipated Birth Control:  Plans Interval BTL   06/22/2022 Jacklyn Shell, CNM

## 2022-06-22 NOTE — Anesthesia Preprocedure Evaluation (Signed)
Anesthesia Evaluation  Patient identified by MRN, date of birth, ID band Patient awake    Reviewed: Allergy & Precautions, Patient's Chart, lab work & pertinent test results  Airway Mallampati: II  TM Distance: >3 FB Neck ROM: Full    Dental no notable dental hx.    Pulmonary neg pulmonary ROS   Pulmonary exam normal breath sounds clear to auscultation       Cardiovascular negative cardio ROS Normal cardiovascular exam Rhythm:Regular Rate:Normal     Neuro/Psych negative neurological ROS     GI/Hepatic negative GI ROS, Neg liver ROS,,,  Endo/Other    Morbid obesity  Renal/GU negative Renal ROS     Musculoskeletal negative musculoskeletal ROS (+)    Abdominal  (+) + obese  Peds  Hematology  (+) Blood dyscrasia, anemia   Anesthesia Other Findings   Reproductive/Obstetrics (+) Pregnancy                             Anesthesia Physical Anesthesia Plan  ASA: 3  Anesthesia Plan: Epidural   Post-op Pain Management:    Induction:   PONV Risk Score and Plan:   Airway Management Planned:   Additional Equipment:   Intra-op Plan:   Post-operative Plan:   Informed Consent: I have reviewed the patients History and Physical, chart, labs and discussed the procedure including the risks, benefits and alternatives for the proposed anesthesia with the patient or authorized representative who has indicated his/her understanding and acceptance.       Plan Discussed with:   Anesthesia Plan Comments:        Anesthesia Quick Evaluation

## 2022-06-22 NOTE — Progress Notes (Signed)
Luceil Del Allieanna Gehring is a 27 y.o. G2P1001 at [redacted]w[redacted]d admitted for IOL in the setting of decreased fetal movement.  Subjective: Citalli is a doing well. She reports painful contractions.   Objective: BP 115/78   Pulse 78   Temp 97.8 F (36.6 C) (Oral)   Resp 16   Ht 5\' 1"  (1.549 m)   Wt 97.7 kg   LMP 09/21/2021   SpO2 99%   BMI 40.70 kg/m  No intake/output data recorded. No intake/output data recorded.  FHT: 115 bpm, moderate variability, +15x15 accels, no decels UC: Q 2-35mins SVE:   Dilation: 3.5 Effacement (%): 80 Station: -2 Exam by:: Camelia Eng, CNM  Labs: Lab Results  Component Value Date   WBC 8.7 06/22/2022   HGB 11.2 (L) 06/22/2022   HCT 34.0 (L) 06/22/2022   MCV 88.1 06/22/2022   PLT 143 (L) 06/22/2022    Assessment / Plan: Lennice Sites is a 27 y.o. G2P1 at [redacted]w[redacted]d admitted for IOL in the setting of decreased fetal movement at term  Labor: Progressing. S/p single dose of Cytotec. Discussed initiating low dose Pitocin vs AROM. Patient desires AROM. Small amount of clear/pink-tinged fluid. Patient and fetus tolerated well. If no progress at next exam, start low dose Pitocin Fetal Wellbeing:  Category I Pain Control:  prn per patient request I/D:  GBS neg Anticipated MOD:  NSVD   Brand Males, CNM 06/22/2022, 5:40 AM

## 2022-06-23 MED ORDER — IBUPROFEN 600 MG PO TABS: 600.0000 mg | ORAL_TABLET | Freq: Four times a day (QID) | ORAL | 0 refills | Status: DC

## 2022-06-23 MED ORDER — SENNOSIDES-DOCUSATE SODIUM 8.6-50 MG PO TABS
2.0000 | ORAL_TABLET | Freq: Every day | ORAL | Status: DC
  Administered 2022-06-23: 2 via ORAL

## 2022-06-23 MED ORDER — ACETAMINOPHEN 325 MG PO TABS: 650.0000 mg | ORAL_TABLET | ORAL | 0 refills | Status: DC | PRN

## 2022-06-23 NOTE — Anesthesia Postprocedure Evaluation (Signed)
Anesthesia Post Note  Patient: Susan Anthony  Procedure(s) Performed: AN AD HOC LABOR EPIDURAL     Patient location during evaluation: Mother Baby Anesthesia Type: Epidural Level of consciousness: awake and alert and oriented Pain management: satisfactory to patient Vital Signs Assessment: post-procedure vital signs reviewed and stable Respiratory status: respiratory function stable Cardiovascular status: stable Postop Assessment: no headache, no backache, epidural receding, patient able to bend at knees, no signs of nausea or vomiting, adequate PO intake and able to ambulate Anesthetic complications: no   No notable events documented.  Last Vitals:  Vitals:   06/23/22 0341 06/23/22 0750  BP: 109/83 104/67  Pulse: 73 70  Resp:  16  Temp: (!) 36.4 C 36.8 C  SpO2: 99% 100%    Last Pain:  Vitals:   06/23/22 0750  TempSrc: Oral  PainSc: 0-No pain   Pain Goal: Patients Stated Pain Goal: 0 (06/22/22 1200)                 Susan Anthony

## 2022-06-23 NOTE — Progress Notes (Signed)
Post Partum Day 1 Subjective: No complaints, up ad lib, voiding, tolerating PO, and pain well controlled. Requesting a bowel regimen. Patient would like to D/C at 3pm today if baby will also be D/C. If baby is not D/C patient would like 1 more night as patient.  Objective: Blood pressure 109/83, pulse 73, temperature (!) 97.5 F (36.4 C), temperature source Oral, resp. rate 18, height 5\' 1"  (1.549 m), weight 97.7 kg, last menstrual period 09/21/2021, SpO2 99 %, unknown if currently breastfeeding.  Physical Exam:  General: alert, cooperative, and no distress Lochia: appropriate Uterine Fundus: firm Incision: N/A DVT Evaluation: No evidence of DVT seen on physical exam. No significant calf/ankle edema.  Recent Labs    06/22/22 0628 06/22/22 1504  HGB 11.3* 10.6*  HCT 34.4* 32.3*    Assessment/Plan: Continue routine postpartum care. Give Senna today per patient's request for bowel regimen. D/C @ 24 hrs if baby is OK to D/C. Otherwise requesting D/C tomorrow.   LOS: 1 day   Tiffany Kocher, DO 06/23/2022, 5:40 AM    Attestation of Attending Supervision of Resident: Evaluation and management procedures were performed by the [facility] Medicine Resident under my supervision. I was immediately available for direct supervision, assistance and direction throughout this encounter.  I also confirm that I have verified the information documented in the resident's note, and that I have also personally reperformed the pertinent components of the physical exam and all of the medical decision making activities. I have also made any necessary editorial changes.   Jaynie Collins, MD, FACOG Attending Obstetrician & Gynecologist, [facility] for [employer], [facility]

## 2022-06-27 ENCOUNTER — Encounter: Payer: Self-pay | Admitting: Medical

## 2022-07-02 ENCOUNTER — Telehealth (HOSPITAL_COMMUNITY): Payer: Self-pay

## 2022-07-02 NOTE — Telephone Encounter (Signed)
Patient did not answer phone call. Voicemail left for patient.   Suann Larry [facility]   07/02/22,1651

## 2022-07-24 ENCOUNTER — Encounter: Payer: Self-pay | Admitting: Obstetrics

## 2022-07-24 ENCOUNTER — Ambulatory Visit (INDEPENDENT_AMBULATORY_CARE_PROVIDER_SITE_OTHER): Payer: Self-pay | Admitting: Obstetrics & Gynecology

## 2022-07-24 DIAGNOSIS — N39498 Other specified urinary incontinence: Secondary | ICD-10-CM

## 2022-07-24 NOTE — Progress Notes (Signed)
Post Partum Visit Note  Tashari Del Kyndra Condron is a 27 y.o. [phone] female who presents for a postpartum visit. She is 4 weeks postpartum following a normal spontaneous vaginal delivery.  I have fully reviewed the prenatal and intrapartum course. The delivery was at 39 gestational weeks.  Anesthesia: epidural. Postpartum course has been complicated by a few episode of involuntary urinary leaking. No dysuria, fevers, just loos of urine sometimes. Happens when sneezes, coughs, but sometimes, just happens on its own. Baby is doing well. Baby is feeding by both breast and bottle - Similac Advance. Bleeding staining only. Bowel function is normal. Bladder function is normal. Patient is not sexually active. Desired contraception method is condoms and rhythm method.   Postpartum depression screening: negative, score 9.  Denies HI/SI.    The pregnancy intention screening data noted above was reviewed. Potential methods of contraception were discussed. The patient elected to proceed with condoms, FAM.   Inocente Salles Postnatal Depression Scale - 07/24/22 1512       Edinburgh Postnatal Depression Scale:  In the Past 7 Days   I have been able to laugh and see the funny side of things. 0    I have looked forward with enjoyment to things. 1    I have blamed myself unnecessarily when things went wrong. 2    I have been anxious or worried for no good reason. 2    I have felt scared or panicky for no good reason. 0    Things have been getting on top of me. 1    I have been so unhappy that I have had difficulty sleeping. 0    I have felt sad or miserable. 2    I have been so unhappy that I have been crying. 1    The thought of harming myself has occurred to me. 0    Edinburgh Postnatal Depression Scale Total 9             Health Maintenance Due  Topic Date Due   COVID-19 Vaccine (1) Never done   HPV VACCINES (1 - 2-dose series) Never done   PAP-Cervical Cytology Screening  10/13/2020   PAP  SMEAR-Modifier  10/13/2020    The following portions of the patient's history were reviewed and updated as appropriate: allergies, current medications, past family history, past medical history, past social history, past surgical history, and problem list.  Review of Systems Pertinent items noted in HPI and remainder of comprehensive ROS otherwise negative.  Objective:  BP 121/81   Pulse 77   Wt 205 lb (93 kg)   LMP 09/21/2021   Breastfeeding Yes   BMI 38.73 kg/m    General:  alert and no distress   Breasts:  normal, done in presence of chaperone  Lungs: clear to auscultation bilaterally  Heart:  regular rate and rhythm  Abdomen: soft, non-tender; bowel sounds normal; no masses,  no organomegaly        Assessment:   Normal postpartum exam.   Plan:   Essential components of care per ACOG recommendations:  1.  Mood and well being: Patient with borderline depression screening today. Reviewed local resources for support. Offered to talk to [facility] clinician, she declined. Offered medication, she declined. Advised to let us know of worsening symptoms. - Patient tobacco use? No.   - hx of drug use? No.    2. Infant care and feeding:  -Patient currently breastmilk feeding? Yes. Reviewed importance of draining breast regularly to support  lactation.  -Social determinants of health (SDOH) reviewed in EPIC. No concerns.  3. Sexuality, contraception and birth spacing - Patient does not want a pregnancy in the next year.  Desired family size is 2 children.  - Reviewed reproductive life planning. Reviewed contraceptive methods based on pt preferences and effectiveness.  Patient desired FAM or LAM and Female Condom today.   Also considering interval BTL, will let us know once she gets insurance. - Discussed birth spacing of 18 months  4. Sleep and fatigue -Encouraged family/partner/community support of 4 hrs of uninterrupted sleep to help with mood and fatigue  5. Physical Recovery  -  Discussed patients delivery and complications. She describes her labor as bad. - Patient had a Vaginal, no problems at delivery. Patient had a 1st degree laceration. Perineal healing reviewed. Patient expressed understanding - Patient has urinary incontinence? Yes. Discussed role of pelvic floor PT. Offered PT and patient declined for now due to cost. Will call once she gets insurance to get referral for this and Urogynecology if symptoms persist. Urine culture checked to evaluate for possible UTI.  - Patient is safe to resume physical and sexual activity  6.  Health Maintenance - HM due items addressed Yes - Last pap smear  Diagnosis  Date Value Ref Range Status  10/13/2017   Final   NEGATIVE FOR INTRAEPITHELIAL LESIONS OR MALIGNANCY.   Pap smear not done at today's visit. Referred to [facility]. -Breast Cancer screening indicated? No.     Jaynie Collins, MD Center for [facility], [facility]

## 2022-07-27 LAB — URINE CULTURE: Organism ID, Bacteria: NO GROWTH

## 2022-07-30 ENCOUNTER — Telehealth: Payer: Self-pay

## 2022-07-30 NOTE — Telephone Encounter (Signed)
Telephoned patient at mobile number using interpreter#[phone]. Left a voice message with [facility] contact information.

## 2022-08-27 ENCOUNTER — Ambulatory Visit: Payer: Self-pay

## 2022-10-30 ENCOUNTER — Ambulatory Visit: Payer: BC Managed Care – PPO | Admitting: Family Medicine

## 2022-10-30 ENCOUNTER — Encounter: Payer: Self-pay | Admitting: Family Medicine

## 2022-10-30 VITALS — BP 120/84 | HR 73 | Temp 98.8°F | Ht 62.8 in | Wt 218.0 lb

## 2022-10-30 DIAGNOSIS — R7309 Other abnormal glucose: Secondary | ICD-10-CM

## 2022-10-30 DIAGNOSIS — F53 Postpartum depression: Secondary | ICD-10-CM | POA: Diagnosis not present

## 2022-10-30 DIAGNOSIS — Z83438 Family history of other disorder of lipoprotein metabolism and other lipidemia: Secondary | ICD-10-CM

## 2022-10-30 DIAGNOSIS — Z6838 Body mass index (BMI) 38.0-38.9, adult: Secondary | ICD-10-CM

## 2022-10-30 DIAGNOSIS — E559 Vitamin D deficiency, unspecified: Secondary | ICD-10-CM

## 2022-10-30 DIAGNOSIS — Z7689 Persons encountering health services in other specified circumstances: Secondary | ICD-10-CM | POA: Diagnosis not present

## 2022-10-30 DIAGNOSIS — Z0000 Encounter for general adult medical examination without abnormal findings: Secondary | ICD-10-CM | POA: Diagnosis not present

## 2022-10-30 DIAGNOSIS — E538 Deficiency of other specified B group vitamins: Secondary | ICD-10-CM | POA: Diagnosis not present

## 2022-10-30 DIAGNOSIS — Z23 Encounter for immunization: Secondary | ICD-10-CM

## 2022-10-30 DIAGNOSIS — E66812 Obesity, class 2: Secondary | ICD-10-CM

## 2022-10-30 NOTE — Progress Notes (Unsigned)
I,Jameka J Llittleton, CMA,acting as a Neurosurgeon for [employer], NP.,have documented all relevant documentation on the behalf of Ellender Hose, NP,as directed by  Ellender Hose, NP while in the presence of Ellender Hose, NP.  Subjective:    Patient ID: Susan Anthony , female    DOB: 01-08-1996 , 27 y.o.   MRN: [id number]  Chief Complaint  Patient presents with  . Establish Care  . Annual Exam    HPI  Patient presents today to establish care. Patient would like to have her annual physical exam done too. Patient states she  had a baby 4 months ago and since then she states that in the past weeks she has just been feeling sad,crying often, thinking that she is not doing enough to care for the baby and the older child(has a 62 year old) . Even though she admits that she is getting help from her mother and her husband , patient states that she still sometimes feels anxious, overwhelmed and stressed out since she had the new baby. Patient was advised to rest and accept all the help she can get .      Past Medical History:  Diagnosis Date  . Chlamydia infection 2019     Family History  Problem Relation Age of Onset  . Diabetes Maternal Grandmother   . Hypertension Maternal Grandmother   . Hyperlipidemia Maternal Grandmother   . Diabetes Paternal Grandmother   . Hypertension Paternal Grandmother   . Hyperlipidemia Paternal Grandmother   . Hypertension Paternal Grandfather     No current outpatient medications on file.   No Known Allergies     Social History   Tobacco Use  Smoking Status Never  . Passive exposure: Never  Smokeless Tobacco Never  . She has been exposed to passive smoke. The patient's alcohol use is:  Social History   Substance and Sexual Activity  Alcohol Use Not Currently     Review of Systems  Constitutional: Negative.   HENT: Negative.    Eyes: Negative.   Respiratory: Negative.    Cardiovascular: Negative.   Gastrointestinal: Negative.    Endocrine: Negative.   Genitourinary: Negative.   Musculoskeletal: Negative.   Skin: Negative.   Allergic/Immunologic: Negative.   Neurological: Negative.   Hematological: Negative.   Psychiatric/Behavioral: Negative.       Today's Vitals   10/30/22 0845  BP: 120/84  Pulse: 73  Temp: 98.8 F (37.1 C)  Weight: 218 lb (98.9 kg)  Height: 5' 2.8" (1.595 m)  PainSc: 0-No pain   Body mass index is 38.86 kg/m.  Wt Readings from Last 3 Encounters:  10/30/22 218 lb (98.9 kg)  07/24/22 205 lb (93 kg)  06/22/22 215 lb 6.2 oz (97.7 kg)     Objective:  Physical Exam Cardiovascular:     Rate and Rhythm: Normal rate and regular rhythm.  Pulmonary:     Effort: Pulmonary effort is normal.     Breath sounds: Normal breath sounds.  Abdominal:     General: Bowel sounds are normal.  Musculoskeletal:        General: Normal range of motion.  Skin:    General: Skin is warm and dry.  Neurological:     General: No focal deficit present.     Mental Status: She is alert and oriented to person, place, and time.        Assessment And Plan:     Establishing care with new doctor, encounter for  Encounter for general adult medical  examination w/o abnormal findings  Immunization due -     Flu vaccine trivalent PF, 6mos and older(Flulaval,Afluria,Fluarix,Fluzone)  Class 2 severe obesity due to excess calories with serious comorbidity and body mass index (BMI) of 38.0 to 38.9 in adult [facility])  Abnormal glucose     Return for 1 year physical. Patient was given opportunity to ask questions. Patient verbalized understanding of the plan and was able to repeat key elements of the plan. All questions were answered to their satisfaction.   Ellender Hose, NP  I, Ellender Hose, NP, have reviewed all documentation for this visit. The documentation on 10/30/22 for the exam, diagnosis, procedures, and orders are all accurate and complete.

## 2022-10-31 LAB — CMP14+EGFR
ALT: 39 [IU]/L — ABNORMAL HIGH (ref 0–32)
AST: 25 [IU]/L (ref 0–40)
Albumin: 4.7 g/dL (ref 4.0–5.0)
Alkaline Phosphatase: 127 [IU]/L — ABNORMAL HIGH (ref 44–121)
BUN/Creatinine Ratio: 19 (ref 9–23)
BUN: 11 mg/dL (ref 6–20)
Bilirubin Total: 0.2 mg/dL (ref 0.0–1.2)
CO2: 22 mmol/L (ref 20–29)
Calcium: 9.5 mg/dL (ref 8.7–10.2)
Chloride: 105 mmol/L (ref 96–106)
Creatinine, Ser: 0.59 mg/dL (ref 0.57–1.00)
Globulin, Total: 2.3 g/dL (ref 1.5–4.5)
Glucose: 85 mg/dL (ref 70–99)
Potassium: 4.4 mmol/L (ref 3.5–5.2)
Sodium: 142 mmol/L (ref 134–144)
Total Protein: 7 g/dL (ref 6.0–8.5)
eGFR: 127 mL/min/{1.73_m2} (ref >59)

## 2022-10-31 LAB — HEMOGLOBIN A1C
Est. average glucose Bld gHb Est-mCnc: 114 mg/dL
Hgb A1c MFr Bld: 5.6 % (ref 4.8–5.6)

## 2022-10-31 LAB — CBC
Hematocrit: 38 % (ref 34.0–46.6)
Hemoglobin: 12.5 g/dL (ref 11.1–15.9)
MCH: 29.7 pg (ref 26.6–33.0)
MCHC: 32.9 g/dL (ref 31.5–35.7)
MCV: 90 fL (ref 79–97)
Platelets: 196 10*3/uL (ref 150–450)
RBC: 4.21 x10E6/uL (ref 3.77–5.28)
RDW: 11.8 % (ref 11.7–15.4)
WBC: 5.9 10*3/uL (ref 3.4–10.8)

## 2022-10-31 LAB — LIPID PANEL
Chol/HDL Ratio: 3.5 {ratio} (ref 0.0–4.4)
Cholesterol, Total: 165 mg/dL (ref 100–199)
HDL: 47 mg/dL (ref >39)
LDL Chol Calc (NIH): 93 mg/dL (ref 0–99)
Triglycerides: 145 mg/dL (ref 0–149)
VLDL Cholesterol Cal: 25 mg/dL (ref 5–40)

## 2022-10-31 LAB — VITAMIN D 25 HYDROXY (VIT D DEFICIENCY, FRACTURES): Vit D, 25-Hydroxy: 23 ng/mL — ABNORMAL LOW (ref 30.0–100.0)

## 2022-10-31 LAB — VITAMIN B12: Vitamin B-12: 885 pg/mL (ref 232–1245)

## 2022-11-06 DIAGNOSIS — Z83438 Family history of other disorder of lipoprotein metabolism and other lipidemia: Secondary | ICD-10-CM | POA: Insufficient documentation

## 2022-11-06 DIAGNOSIS — R7309 Other abnormal glucose: Secondary | ICD-10-CM | POA: Insufficient documentation

## 2022-11-06 DIAGNOSIS — E538 Deficiency of other specified B group vitamins: Secondary | ICD-10-CM | POA: Insufficient documentation

## 2022-11-06 DIAGNOSIS — E559 Vitamin D deficiency, unspecified: Secondary | ICD-10-CM | POA: Insufficient documentation

## 2022-11-06 DIAGNOSIS — F53 Postpartum depression: Secondary | ICD-10-CM | POA: Insufficient documentation

## 2022-11-07 ENCOUNTER — Ambulatory Visit: Payer: BC Managed Care – PPO

## 2022-11-07 ENCOUNTER — Other Ambulatory Visit: Payer: Self-pay | Admitting: Family Medicine

## 2022-11-07 DIAGNOSIS — E66812 Obesity, class 2: Secondary | ICD-10-CM | POA: Insufficient documentation

## 2022-11-07 MED ORDER — VITAMIN D (ERGOCALCIFEROL) 1.25 MG (50000 UNIT) PO CAPS: 50000.0000 [IU] | ORAL_CAPSULE | ORAL | 0 refills | Status: AC

## 2022-11-07 NOTE — Assessment & Plan Note (Signed)
She is encouraged to strive for BMI less than 30 to decrease cardiac risk. Advised to aim for at least 150 minutes of exercise per week.  

## 2022-11-07 NOTE — Assessment & Plan Note (Signed)
Agreed to get counseling; does not want meds.

## 2022-11-07 NOTE — Assessment & Plan Note (Signed)
Low fat diet advised

## 2022-12-24 ENCOUNTER — Ambulatory Visit: Payer: BC Managed Care – PPO | Admitting: Behavioral Health

## 2023-01-08 ENCOUNTER — Encounter: Payer: BC Managed Care – PPO | Admitting: Obstetrics and Gynecology

## 2023-01-20 ENCOUNTER — Ambulatory Visit: Payer: BC Managed Care – PPO | Admitting: Behavioral Health

## 2023-01-20 NOTE — Progress Notes (Unsigned)
                Susan Jewell L Farryn Linares, LMFT 

## 2023-04-11 ENCOUNTER — Ambulatory Visit: Payer: Self-pay

## 2023-04-11 VITALS — BP 126/87

## 2023-04-11 DIAGNOSIS — R300 Dysuria: Secondary | ICD-10-CM

## 2023-04-11 LAB — POCT URINALYSIS DIPSTICK
Bilirubin, UA: NEGATIVE
Glucose, UA: NEGATIVE
Ketones, UA: NEGATIVE
Protein, UA: NEGATIVE
Spec Grav, UA: 1.01 (ref 1.010–1.025)
Urobilinogen, UA: 0.2 U/dL
pH, UA: 5 (ref 5.0–8.0)

## 2023-04-11 MED ORDER — NITROFURANTOIN MONOHYD MACRO 100 MG PO CAPS: 100.0000 mg | ORAL_CAPSULE | Freq: Two times a day (BID) | ORAL | 0 refills | Status: AC

## 2023-04-11 MED ORDER — PHENAZOPYRIDINE HCL 200 MG PO TABS: 200.0000 mg | ORAL_TABLET | Freq: Three times a day (TID) | ORAL | 0 refills | Status: AC | PRN

## 2023-04-11 NOTE — Progress Notes (Signed)
 SUBJECTIVE: Susan Anthony is a 28 y.o. female who complains of urinary frequency, urgency and dysuria x 3 days, without flank pain, fever, chills, or abnormal vaginal discharge or bleeding.   OBJECTIVE: Appears well, in no apparent distress.  Vital signs are normal. Urine dipstick shows positive for leukocytes, nitrates, RBCs.    ASSESSMENT: Dysuria  PLAN: Treatment per orders.  Call or return to clinic prn if these symptoms worsen or fail to improve as anticipated.

## 2023-04-14 LAB — URINE CULTURE

## 2023-11-03 ENCOUNTER — Encounter: Payer: BC Managed Care – PPO | Admitting: Nurse Practitioner

## 2023-11-10 ENCOUNTER — Other Ambulatory Visit: Payer: Self-pay

## 2023-11-10 ENCOUNTER — Encounter (HOSPITAL_COMMUNITY): Payer: Self-pay

## 2023-11-10 ENCOUNTER — Emergency Department (HOSPITAL_COMMUNITY)
Admission: EM | Admit: 2023-11-10 | Discharge: 2023-11-11 | Discharge status: Against Medical Advice | Payer: Self-pay | Attending: Emergency Medicine | Admitting: Emergency Medicine

## 2023-11-10 DIAGNOSIS — Z5321 Procedure and treatment not carried out due to patient leaving prior to being seen by health care provider: Secondary | ICD-10-CM | POA: Insufficient documentation

## 2023-11-10 DIAGNOSIS — R200 Anesthesia of skin: Secondary | ICD-10-CM | POA: Insufficient documentation

## 2023-11-10 DIAGNOSIS — R519 Headache, unspecified: Secondary | ICD-10-CM | POA: Insufficient documentation

## 2023-11-10 LAB — URINALYSIS, ROUTINE W REFLEX MICROSCOPIC
Bilirubin Urine: NEGATIVE
Glucose, UA: NEGATIVE mg/dL
Hgb urine dipstick: NEGATIVE
Ketones, ur: NEGATIVE mg/dL
Leukocytes,Ua: NEGATIVE
Nitrite: NEGATIVE
Protein, ur: NEGATIVE mg/dL
Specific Gravity, Urine: 1.025 (ref 1.005–1.030)
pH: 5 (ref 5.0–8.0)

## 2023-11-10 LAB — CBC
HCT: 35.3 % — ABNORMAL LOW (ref 36.0–46.0)
Hemoglobin: 11.4 g/dL — ABNORMAL LOW (ref 12.0–15.0)
MCH: 30.2 pg (ref 26.0–34.0)
MCHC: 32.3 g/dL (ref 30.0–36.0)
MCV: 93.6 fL (ref 80.0–100.0)
Platelets: 184 K/uL (ref 150–400)
RBC: 3.77 MIL/uL — ABNORMAL LOW (ref 3.87–5.11)
RDW: 12.4 % (ref 11.5–15.5)
WBC: 8.6 K/uL (ref 4.0–10.5)
nRBC: 0 % (ref 0.0–0.2)

## 2023-11-10 NOTE — ED Triage Notes (Addendum)
 Patient reports she has had a lot of tension on her right head today, and now the right side of her face is numb. Patient reports feeling a lot of pressure in the right eye.  She reports that she has had the numbness for the past hour.  Denies headaches or migraines.

## 2023-11-10 NOTE — ED Notes (Signed)
 Orders per Jerral, MD

## 2023-11-11 LAB — BASIC METABOLIC PANEL WITH GFR
Anion gap: 10 (ref 5–15)
BUN: 9 mg/dL (ref 6–20)
CO2: 22 mmol/L (ref 22–32)
Calcium: 9.5 mg/dL (ref 8.9–10.3)
Chloride: 106 mmol/L (ref 98–111)
Creatinine, Ser: 0.57 mg/dL (ref 0.44–1.00)
GFR, Estimated: >60 mL/min (ref >60)
Glucose, Bld: 100 mg/dL — ABNORMAL HIGH (ref 70–99)
Potassium: 3.8 mmol/L (ref 3.5–5.1)
Sodium: 138 mmol/L (ref 135–145)

## 2023-11-11 LAB — HCG, SERUM, QUALITATIVE: Preg, Serum: NEGATIVE

## 2023-11-11 NOTE — ED Notes (Signed)
 Called 2x for vitals no answer
